# Patient Record
Sex: Female | Born: 1970
Health system: Southern US, Community
[De-identification: ages and names within clinical notes are randomized; demographics above are authoritative.]

## PROBLEM LIST (undated history)

## (undated) DIAGNOSIS — F32A Depression, unspecified: Secondary | ICD-10-CM

## (undated) DIAGNOSIS — R300 Dysuria: Secondary | ICD-10-CM

## (undated) DIAGNOSIS — IMO0002 Reserved for concepts with insufficient information to code with codable children: Secondary | ICD-10-CM

## (undated) DIAGNOSIS — N7093 Salpingitis and oophoritis, unspecified: Secondary | ICD-10-CM

## (undated) DIAGNOSIS — N390 Urinary tract infection, site not specified: Secondary | ICD-10-CM

## (undated) DIAGNOSIS — R030 Elevated blood-pressure reading, without diagnosis of hypertension: Secondary | ICD-10-CM

## (undated) DIAGNOSIS — F329 Major depressive disorder, single episode, unspecified: Secondary | ICD-10-CM

## (undated) DIAGNOSIS — F419 Anxiety disorder, unspecified: Secondary | ICD-10-CM

## (undated) HISTORY — PX: NO PAST SURGERIES: SHX2092

---

## 2014-10-20 ENCOUNTER — Other Ambulatory Visit: Payer: Self-pay

## 2014-10-20 DIAGNOSIS — Z1231 Encounter for screening mammogram for malignant neoplasm of breast: Secondary | ICD-10-CM

## 2014-11-03 ENCOUNTER — Ambulatory Visit
Admission: RE | Admit: 2014-11-03 | Discharge: 2014-11-03 | Disposition: A | Discharge status: Home / Self Care | Payer: 59 | Source: Ambulatory Visit

## 2014-11-03 DIAGNOSIS — Z1231 Encounter for screening mammogram for malignant neoplasm of breast: Secondary | ICD-10-CM

## 2015-04-06 ENCOUNTER — Other Ambulatory Visit (HOSPITAL_COMMUNITY)
Admission: RE | Admit: 2015-04-06 | Discharge: 2015-04-06 | Disposition: A | Discharge status: Home / Self Care | Payer: 59 | Source: Ambulatory Visit | Attending: Obstetrics and Gynecology | Admitting: Obstetrics and Gynecology

## 2015-04-06 DIAGNOSIS — Z01419 Encounter for gynecological examination (general) (routine) without abnormal findings: Secondary | ICD-10-CM | POA: Insufficient documentation

## 2015-04-06 DIAGNOSIS — Z113 Encounter for screening for infections with a predominantly sexual mode of transmission: Secondary | ICD-10-CM | POA: Insufficient documentation

## 2015-04-06 DIAGNOSIS — Z1151 Encounter for screening for human papillomavirus (HPV): Secondary | ICD-10-CM | POA: Diagnosis present

## 2015-05-11 DIAGNOSIS — R55 Syncope and collapse: Secondary | ICD-10-CM | POA: Diagnosis not present

## 2015-05-11 DIAGNOSIS — I10 Essential (primary) hypertension: Secondary | ICD-10-CM | POA: Diagnosis not present

## 2015-05-11 DIAGNOSIS — E559 Vitamin D deficiency, unspecified: Secondary | ICD-10-CM | POA: Diagnosis not present

## 2015-05-11 DIAGNOSIS — F322 Major depressive disorder, single episode, severe without psychotic features: Secondary | ICD-10-CM | POA: Diagnosis not present

## 2015-06-11 MED FILL — NORETHINDRONE 0.35 MG TAB: 0.35 | 84 days supply | Qty: 84 | Fill #0

## 2015-06-17 MED FILL — VIT D2 1.25 MG (50,000 UNIT: 1.25 MG | 84 days supply | Qty: 12 | Fill #0

## 2015-07-26 MED FILL — SERTRALINE HCL 50 MG TABLET: 50 | 90 days supply | Qty: 90 | Fill #0

## 2015-08-26 MED FILL — traZODone HCL 50 MG TABS: 50 | 90 days supply | Qty: 90 | Fill #0

## 2015-09-09 DIAGNOSIS — E559 Vitamin D deficiency, unspecified: Secondary | ICD-10-CM | POA: Diagnosis not present

## 2015-10-11 ENCOUNTER — Encounter (HOSPITAL_BASED_OUTPATIENT_CLINIC_OR_DEPARTMENT_OTHER): Payer: Self-pay | Admitting: Emergency Medicine

## 2015-10-11 ENCOUNTER — Emergency Department (HOSPITAL_BASED_OUTPATIENT_CLINIC_OR_DEPARTMENT_OTHER): Payer: 59

## 2015-10-11 ENCOUNTER — Emergency Department (HOSPITAL_BASED_OUTPATIENT_CLINIC_OR_DEPARTMENT_OTHER)
Admission: EM | Admit: 2015-10-11 | Discharge: 2015-10-12 | Disposition: A | Discharge status: Home / Self Care | Payer: 59 | Attending: Emergency Medicine | Admitting: Emergency Medicine

## 2015-10-11 DIAGNOSIS — E663 Overweight: Secondary | ICD-10-CM | POA: Insufficient documentation

## 2015-10-11 DIAGNOSIS — S43015A Anterior dislocation of left humerus, initial encounter: Secondary | ICD-10-CM | POA: Diagnosis not present

## 2015-10-11 DIAGNOSIS — Z79899 Other long term (current) drug therapy: Secondary | ICD-10-CM | POA: Diagnosis not present

## 2015-10-11 DIAGNOSIS — I10 Essential (primary) hypertension: Secondary | ICD-10-CM | POA: Diagnosis not present

## 2015-10-11 DIAGNOSIS — F17200 Nicotine dependence, unspecified, uncomplicated: Secondary | ICD-10-CM | POA: Diagnosis not present

## 2015-10-11 DIAGNOSIS — M25512 Pain in left shoulder: Secondary | ICD-10-CM | POA: Diagnosis not present

## 2015-10-11 DIAGNOSIS — F329 Major depressive disorder, single episode, unspecified: Secondary | ICD-10-CM | POA: Insufficient documentation

## 2015-10-11 DIAGNOSIS — Y939 Activity, unspecified: Secondary | ICD-10-CM | POA: Insufficient documentation

## 2015-10-11 DIAGNOSIS — Y999 Unspecified external cause status: Secondary | ICD-10-CM | POA: Insufficient documentation

## 2015-10-11 DIAGNOSIS — W010XXA Fall on same level from slipping, tripping and stumbling without subsequent striking against object, initial encounter: Secondary | ICD-10-CM | POA: Insufficient documentation

## 2015-10-11 DIAGNOSIS — S4992XA Unspecified injury of left shoulder and upper arm, initial encounter: Secondary | ICD-10-CM | POA: Diagnosis not present

## 2015-10-11 DIAGNOSIS — Y929 Unspecified place or not applicable: Secondary | ICD-10-CM | POA: Diagnosis not present

## 2015-10-11 HISTORY — DX: Depression, unspecified: F32.A

## 2015-10-11 HISTORY — DX: Anxiety disorder, unspecified: F41.9

## 2015-10-11 HISTORY — DX: Major depressive disorder, single episode, unspecified: F32.9

## 2015-10-11 MED ORDER — OXYCODONE-ACETAMINOPHEN 5-325 MG PO TABS
1.0000 | ORAL_TABLET | Freq: Once | ORAL | Status: AC
  Administered 2015-10-11: 1 via ORAL
  Filled 2015-10-11: qty 1

## 2015-10-11 MED ORDER — LIDOCAINE HCL 2 % IJ SOLN
INTRAMUSCULAR | Status: AC
  Administered 2015-10-12
  Filled 2015-10-11: qty 20

## 2015-10-11 MED ORDER — FENTANYL CITRATE (PF) 100 MCG/2ML IJ SOLN
50.0000 ug | Freq: Once | INTRAMUSCULAR | Status: AC
  Administered 2015-10-12: 50 ug via INTRAVENOUS
  Filled 2015-10-11: qty 2

## 2015-10-11 NOTE — ED Provider Notes (Signed)
CSN: [id number]     Arrival date & time 10/11/15  2215 History  By signing my name below, I, Rowan Blase, attest that this documentation has been prepared under the direction and in the presence of Merryl Hacker, MD . Electronically Signed: Rowan Blase, Scribe. 10/11/2015. 11:15 PM.    Chief Complaint  Patient presents with  . Shoulder Injury    The history is provided by the patient. No language interpreter was used.   HPI Comments:  Sandra Hill is a 45 y.o. female with PMHx of HTN who presents to the Emergency Department s/p fall complaining of sudden onset, severe left shoulder pain. Pt slipped on the floor just PTA and fell with her left arm outstretched. While pt was being taken to x-ray, she states her arm fell off the pillow she was using for support and something in her shoulder shifted; pain has improved since and is currently 4/10. Pt reports associated numbness in arm earlier tonight, currently alleviated. Denies syncope, head trauma, or any major medical problems.   Past Medical History  Diagnosis Date  . Hypertension   . Depression   . Anxiety    History reviewed. No pertinent past surgical history. History reviewed. No pertinent family history. Social History  Substance Use Topics  . Smoking status: Current Some Day Smoker  . Smokeless tobacco: None  . Alcohol Use: Yes   OB History    No data available     Review of Systems  Musculoskeletal: Positive for arthralgias.  Neurological: Negative for syncope and numbness.  All other systems reviewed and are negative.  Allergies  Review of patient's allergies indicates no known allergies.  Home Medications   Prior to Admission medications   Medication Sig Start Date End Date Taking? Authorizing Provider  hydrochlorothiazide (MICROZIDE) 12.5 MG capsule Take 12.5 mg by mouth daily.   Yes Historical Provider, MD  sertraline (ZOLOFT) 50 MG tablet Take 50 mg by mouth daily.   Yes Historical Provider,  MD  traZODone (DESYREL) 100 MG tablet Take 100 mg by mouth at bedtime.   Yes Historical Provider, MD  oxyCODONE-acetaminophen (PERCOCET/ROXICET) 5-325 MG tablet Take 1 tablet by mouth every 6 (six) hours as needed for severe pain. 10/12/15   Merryl Hacker, MD   BP 139/86 mmHg  Pulse 65  Temp(Src) 98.9 F (37.2 C) (Oral)  Resp 18  Ht 5\' 6"  (1.676 m)  Wt 240 lb (108.863 kg)  BMI 38.76 kg/m2  SpO2 97%  LMP 09/27/2015 Physical Exam  Constitutional: She is oriented to person, place, and time. She appears well-developed and well-nourished.  Overweight  HENT:  Head: Normocephalic and atraumatic.  Cardiovascular: Normal rate, regular rhythm and normal heart sounds.   Pulmonary/Chest: Effort normal and breath sounds normal. No respiratory distress. She has no wheezes.  Musculoskeletal:  No tenderness to palpation along the clavicle, no obvious deformity, limited range of motion with abduction and flexion of the shoulder, 2+ radial pulse  Neurological: She is alert and oriented to person, place, and time.  Skin: Skin is warm and dry.  Psychiatric: She has a normal mood and affect.  Nursing note and vitals reviewed.   ED Course  Procedures   Reduction of dislocation Date/Time: 6:55 AM Performed by: Merryl Hacker Authorized by: Merryl Hacker Consent: Verbal consent obtained. Risks and benefits: risks, benefits and alternatives were discussed Consent given by: patient Required items: required blood products, implants, devices, and special equipment available Time out: Immediately prior to procedure a "  time out" was called to verify the correct patient, procedure, equipment, support staff and site/side marked as required.  Patient sedated: no  Vitals: Vital signs were monitored during sedation. Patient tolerance: Patient tolerated the procedure well with no immediate complications. Patient was given 50 g of fentanyl. Joint was injected with 20 mL of lidocaine for  analgesia. Joint: Left shoulder Reduction technique: FARES  DIAGNOSTIC STUDIES:  Oxygen Saturation is 100% on RA, normal by my interpretation.    COORDINATION OF CARE:  11:14 PM Will administer pain medication. Discussed treatment plan with pt at bedside and pt agreed to plan.  Labs Review Labs Reviewed - No data to display  Imaging Review Dg Shoulder Left  10/12/2015  CLINICAL DATA:  Status post reduction of left humeral head dislocation. Initial encounter. EXAM: LEFT SHOULDER - 2+ VIEW COMPARISON:  Left shoulder radiographs performed 10/11/2015 FINDINGS: There has been reduction of the apparent left humeral head dislocation. No definite fracture is seen. There is slight apparent cortical irregularity along the glenoid. The left acromioclavicular joint is unremarkable in appearance. No definite soft tissue abnormalities are characterized on radiograph. IMPRESSION: Successful reduction of apparent left humeral head dislocation. No definite fracture seen. Electronically Signed   By: Garald Balding M.D.   On: 10/12/2015 00:53   Dg Shoulder Left  10/11/2015  CLINICAL DATA:  Slipped and fell on hardwood floor, with injury to left shoulder. Left anterior shoulder pain. Initial encounter. EXAM: LEFT SHOULDER - 2+ VIEW COMPARISON:  None. FINDINGS: There is question of anterior subluxation or mild dislocation of the left humeral head. There is no evidence of fracture. The acromioclavicular joint is unremarkable in appearance. No significant soft tissue abnormalities are seen. The visualized portions of the left lung are clear. IMPRESSION: Question of anterior subluxation or mild dislocation of the left humeral head. No evidence of fracture. Would correlate with the patient's symptoms. Electronically Signed   By: Garald Balding M.D.   On: 10/11/2015 23:26   I have personally reviewed and evaluated these images and lab results as part of my medical decision-making.   EKG Interpretation None       MDM   Final diagnoses:  Anterior shoulder dislocation, left, initial encounter    Patient presents with anterior left shoulder dislocation.Neurovascularly intact. Shoulder was injected with lidocaine and patient was given pain medication. Reduced without palpitation at the bedside. Follow-up with orthopedist.  After history, exam, and medical workup I feel the patient has been appropriately medically screened and is safe for discharge home. Pertinent diagnoses were discussed with the patient. Patient was given return precautions.  I personally performed the services described in this documentation, which was scribed in my presence. The recorded information has been reviewed and is accurate.    Merryl Hacker, MD 10/12/15 [phone]

## 2015-10-11 NOTE — ED Notes (Signed)
Pt states she fell on left shoulder and injured it.

## 2015-10-12 ENCOUNTER — Emergency Department (HOSPITAL_BASED_OUTPATIENT_CLINIC_OR_DEPARTMENT_OTHER): Payer: 59

## 2015-10-12 DIAGNOSIS — Z79899 Other long term (current) drug therapy: Secondary | ICD-10-CM | POA: Diagnosis not present

## 2015-10-12 DIAGNOSIS — F17200 Nicotine dependence, unspecified, uncomplicated: Secondary | ICD-10-CM | POA: Diagnosis not present

## 2015-10-12 DIAGNOSIS — I10 Essential (primary) hypertension: Secondary | ICD-10-CM | POA: Diagnosis not present

## 2015-10-12 DIAGNOSIS — S43005A Unspecified dislocation of left shoulder joint, initial encounter: Secondary | ICD-10-CM | POA: Diagnosis not present

## 2015-10-12 DIAGNOSIS — F329 Major depressive disorder, single episode, unspecified: Secondary | ICD-10-CM | POA: Diagnosis not present

## 2015-10-12 DIAGNOSIS — E663 Overweight: Secondary | ICD-10-CM | POA: Diagnosis not present

## 2015-10-12 DIAGNOSIS — S43015A Anterior dislocation of left humerus, initial encounter: Secondary | ICD-10-CM | POA: Diagnosis not present

## 2015-10-12 MED ORDER — OXYCODONE-ACETAMINOPHEN 5-325 MG PO TABS: 1.0000 | ORAL_TABLET | Freq: Four times a day (QID) | ORAL | Status: DC | PRN

## 2015-10-12 MED FILL — OXYCODONE/APAP 5/325 MG TAB: 5-325 | 2 days supply | Qty: 10 | Fill #0

## 2015-10-12 NOTE — Discharge Instructions (Signed)
Shoulder Dislocation A shoulder dislocation happens when the upper arm bone (humerus) moves out of the shoulder joint. The shoulder joint is the part of the shoulder where the humerus, shoulder blade (scapula), and collarbone (clavicle) meet. CAUSES This condition is often caused by:  A fall.  A hit to the shoulder.  A forceful movement of the shoulder. RISK FACTORS This condition is more likely to develop in people who play sports. SYMPTOMS Symptoms of this condition include:  Deformity of the shoulder.  Intense pain.  Inability to move the shoulder.  Numbness, weakness, or tingling in your neck or down your arm.  Bruising or swelling around your shoulder. DIAGNOSIS This condition is diagnosed with a physical exam. After the exam, tests may be done to check for related problems. Tests that may be done include:  X-ray. This may be done to check for broken bones.  MRI. This may be done to check for damage to the tissues around the shoulder.  Electromyogram. This may be done to check for nerve damage. TREATMENT This condition is treated with a procedure to place the humerus back in the joint. This procedure is called a reduction. There are two types of reduction:  Closed reduction. In this procedure, the humerus is placed back in the joint without surgery. The health care provider uses his or her hands to guide the bone back into place.  Open reduction. In this procedure, the humerus is placed back in the joint with surgery. An open reduction may be recommended if:  You have a weak shoulder joint or weak ligaments.  You have had more than one shoulder dislocation.  The nerves or blood vessels around your shoulder have been damaged. After the humerus is placed back into the joint, your arm will be placed in a splint or sling to prevent it from moving. You will need to wear the splint or sling until your shoulder heals. When the splint or sling is removed, you may have  physical therapy to help improve the range of motion in your shoulder joint. HOME CARE INSTRUCTIONS If You Have a Splint or Sling:  Wear it as told by your health care provider. Remove it only as told by your health care provider.  Loosen it if your fingers become numb and tingle, or if they turn cold and blue.  Keep it clean and dry. Bathing  Do not take baths, swim, or use a hot tub until your health care provider approves. Ask your health care provider if you can take showers. You may only be allowed to take sponge baths for bathing.  If your health care provider approves bathing and showering, cover your splint or sling with a watertight plastic bag to protect it from water. Do not let the splint or sling get wet. Managing Pain, Stiffness, and Swelling  If directed, apply ice to the injured area.  Put ice in a plastic bag.  Place a towel between your skin and the bag.  Leave the ice on for 20 minutes, 2-3 times per day.  Move your fingers often to avoid stiffness and to decrease swelling.  Raise (elevate) the injured area above the level of your heart while you are sitting or lying down. Driving  Do not drive while wearing a splint or sling on a hand that you use for driving.  Do not drive or operate heavy machinery while taking pain medicine. Activity  Return to your normal activities as told by your health care provider. Ask your  health care provider what activities are safe for you.  Perform range-of-motion exercises only as told by your health care provider.  Exercise your hand by squeezing a soft ball. This helps to decrease stiffness and swelling in your hand and wrist. General Instructions  Take over-the-counter and prescription medicines only as told by your health care provider.  Do not use any tobacco products, including cigarettes, chewing tobacco, or e-cigarettes. Tobacco can delay bone and tissue healing. If you need help quitting, ask your health care  provider.  Keep all follow-up visits as told by your health care provider. This is important. SEEK MEDICAL CARE IF:  Your splint or sling gets damaged. SEEK IMMEDIATE MEDICAL CARE IF:  Your pain gets worse rather than better.  You lose feeling in your arm or hand.  Your arm or hand becomes white and cold.   This information is not intended to replace advice given to you by your health care provider. Make sure you discuss any questions you have with your health care provider.   Document Released: 01/03/2001 Document Revised: 12/30/2014 Document Reviewed: 08/03/2014 Elsevier Interactive Patient Education [employer].

## 2015-10-18 DIAGNOSIS — F322 Major depressive disorder, single episode, severe without psychotic features: Secondary | ICD-10-CM | POA: Diagnosis not present

## 2015-10-18 DIAGNOSIS — I10 Essential (primary) hypertension: Secondary | ICD-10-CM | POA: Diagnosis not present

## 2015-10-18 MED FILL — SERTRALINE HCL 100 MG TAB: 100 | 30 days supply | Qty: 30 | Fill #0

## 2015-10-19 ENCOUNTER — Encounter: Payer: Self-pay | Admitting: Family Medicine

## 2015-10-19 ENCOUNTER — Ambulatory Visit (INDEPENDENT_AMBULATORY_CARE_PROVIDER_SITE_OTHER): Payer: 59 | Admitting: Family Medicine

## 2015-10-19 VITALS — BP 145/96 | HR 73 | Ht 67.0 in | Wt 245.0 lb

## 2015-10-19 DIAGNOSIS — S4992XA Unspecified injury of left shoulder and upper arm, initial encounter: Secondary | ICD-10-CM | POA: Diagnosis not present

## 2015-10-19 DIAGNOSIS — S43005A Unspecified dislocation of left shoulder joint, initial encounter: Secondary | ICD-10-CM

## 2015-10-19 NOTE — Patient Instructions (Signed)
You had a shoulder dislocation. Use sling for next 2 weeks unless physical therapy recommends discontinuing this before I see you. Ibuprofen or aleve only if needed. Percocet if needed for severe pain. Icing 15 minutes at a time as needed (use after therapy) Do home exercises on days you don't go to therapy. Come out of sling at least twice a day to do easy elbow motion exercises (straighten, flex 10 times). Follow up with me in 2 weeks. See work note for restrictions.

## 2015-10-21 DIAGNOSIS — S43005A Unspecified dislocation of left shoulder joint, initial encounter: Secondary | ICD-10-CM | POA: Insufficient documentation

## 2015-10-21 NOTE — Assessment & Plan Note (Signed)
first dislocation.  Clinically much improved.  Radiographs did not show associated fracture.  Will continue with sling for 1-2 more weeks, start physical therapy now.  Ibuprofen or aleve if needed, percocet as needed for severe pain.  Icing for pain.  F/u in 2 weeks.  Work note provided.

## 2015-10-21 NOTE — Progress Notes (Signed)
PCP: No primary care provider on file.  Subjective:   HPI: Patient is a 45 y.o. female here for left shoulder injury.  Patient reports on 6/20 she was at home helping put drops in a dog's ears. Her left hand was on the ground, slid laterally and caused her left shoulder to pop out of place. Immediate very severe pain. Went to ED - had this relocated. Now pain is down to 2/10, more dull. Has been resting in sling. This is first dislocation. Feels achy in morning. Hard to dress. Not taking any medication for this now. No skin changes, numbness.  Past Medical History  Diagnosis Date  . Hypertension   . Depression   . Anxiety     Current Outpatient Prescriptions on File Prior to Visit  Medication Sig Dispense Refill  . hydrochlorothiazide (MICROZIDE) 12.5 MG capsule Take 12.5 mg by mouth daily.    Marland Kitchen oxyCODONE-acetaminophen (PERCOCET/ROXICET) 5-325 MG tablet Take 1 tablet by mouth every 6 (six) hours as needed for severe pain. 10 tablet 0  . sertraline (ZOLOFT) 50 MG tablet Take 50 mg by mouth daily.    . traZODone (DESYREL) 100 MG tablet Take 100 mg by mouth at bedtime.     No current facility-administered medications on file prior to visit.    No past surgical history on file.  No Known Allergies  Social History   Social History  . Marital Status: Married    Spouse Name: N/A  . Number of Children: N/A  . Years of Education: N/A   Occupational History  . Not on file.   Social History Main Topics  . Smoking status: Current Some Day Smoker  . Smokeless tobacco: Not on file  . Alcohol Use: 0.0 oz/week    0 Standard drinks or equivalent per week  . Drug Use: No  . Sexual Activity: Not on file   Other Topics Concern  . Not on file   Social History Narrative    No family history on file.  BP 145/96 mmHg  Pulse 73  Ht 5\' 7"  (1.702 m)  Wt 245 lb (111.131 kg)  BMI 38.36 kg/m2  LMP 09/27/2015  Review of Systems: See HPI above.    Objective:  Physical  Exam:  Gen: NAD, comfortable in exam room  Left shoulder: No swelling, ecchymoses.  No gross deformity. No TTP. Full IR and ER.  Did not test extent of flexion and abduction today. FROM elbow, wrist, digits with 5/5 strength. Strength 5/5 with resisted internal/external rotation. NV intact distally. Sensation intact to light touch including axillary distribution.    Assessment & Plan:  1. Left shoulder dislocation - first dislocation.  Clinically much improved.  Radiographs did not show associated fracture.  Will continue with sling for 1-2 more weeks, start physical therapy now.  Ibuprofen or aleve if needed, percocet as needed for severe pain.  Icing for pain.  F/u in 2 weeks.  Work note provided.

## 2015-10-28 DIAGNOSIS — N7011 Chronic salpingitis: Secondary | ICD-10-CM | POA: Diagnosis not present

## 2015-10-28 DIAGNOSIS — N83209 Unspecified ovarian cyst, unspecified side: Secondary | ICD-10-CM | POA: Diagnosis not present

## 2015-11-02 ENCOUNTER — Ambulatory Visit: Payer: 59 | Attending: Family Medicine | Admitting: Rehabilitative and Restorative Service Providers"

## 2015-11-02 ENCOUNTER — Ambulatory Visit: Payer: Self-pay | Admitting: Family Medicine

## 2015-11-02 DIAGNOSIS — M6281 Muscle weakness (generalized): Secondary | ICD-10-CM | POA: Diagnosis not present

## 2015-11-02 DIAGNOSIS — R293 Abnormal posture: Secondary | ICD-10-CM | POA: Insufficient documentation

## 2015-11-02 DIAGNOSIS — M25512 Pain in left shoulder: Secondary | ICD-10-CM | POA: Diagnosis not present

## 2015-11-02 NOTE — Patient Instructions (Signed)
Advised pt to sleep in sling at night to prevent her from rolling onto L side; HEP issued consisting of shoulder ext isometric with elbow extended and flexed with 3 sec holds each supine; standing shoulder ER/shoulder flex/adduction isometric with towel roll x 3 sec each. All x 15 reps 1x/day. Advised pt to continue to ice due to swelling around scapula and clavicle L. Pt demonstrated understanding

## 2015-11-02 NOTE — Therapy (Signed)
[facility] [address], [place], [place], [address] Phone: [phone]   Fax:  [phone]  Physical Therapy Evaluation  Patient Details  Name: Sandra Hill MRN: [id number] Date of Birth: 09-05-1970 Referring Provider: Barbaraann Barthel  Encounter Date: 11/02/2015      PT End of Session - 11/02/15 0929    Visit Number 1   Number of Visits 16   Date for PT Re-Evaluation 12/28/15   PT Start Time 0804   PT Stop Time 0900   PT Time Calculation (min) 56 min   Activity Tolerance Patient tolerated treatment well;No increased pain   Behavior During Therapy [facility] for tasks assessed/performed      Past Medical History  Diagnosis Date  . Hypertension   . Depression   . Anxiety     History reviewed. No pertinent past surgical history.  There were no vitals filed for this visit.       Subjective Assessment - 11/02/15 0915    Subjective "It only hurts with certain motions"   Pertinent History Pt is a 45 YO L shoulder dislocation when putting drops in dogs outs with L hand anchored on ground when it slid laterally causing shoulder dislocation. XRays only performed at hospital. MD orders with sling 1-2 weeks as of 10/19/15. Relocation at ER. Pt reports "pins and needles" in AM only. Pt now with decreased shoulder ROM, strength L and with overall decreased mobility.   Limitations Lifting;Writing;House hold activities   How long can you sit comfortably? 30 + minutes   How long can you stand comfortably? 30 + minutes   How long can you walk comfortably? 30 + minutes   Diagnostic tests Xrays only   Patient Stated Goals to return to my daily routine of household chores and putting on my bra normally   Currently in Pain? Yes   Pain Score 4    Pain Location Shoulder   Pain Orientation Left   Pain Descriptors / Indicators Pins and needles;Nagging;Aching   Pain Type Neuropathic pain   Pain Radiating Towards biceps L   Pain Onset 1  to 4 weeks ago   Pain Frequency Intermittent   Aggravating Factors  putting on bra, closing car door   Pain Relieving Factors rest, ice   Effect of Pain on Daily Activities moderate; has to find compensatory strategies   Multiple Pain Sites No            [facility] PT Assessment - 11/02/15 0001    Assessment   Medical Diagnosis L shoulder pain s/p disclocation   Referring Provider [place]   Onset Date/Surgical Date 10/11/15   Hand Dominance --  right   Next MD Visit --  not scheduled   Prior Therapy none   Precautions   Precautions --  sling off November 16 2015   Precaution Comments not overhead flexion   Required Braces or Orthoses Sling  2 weeks   Restrictions   Weight Bearing Restrictions --  on light duty at work, no ladder usage until MD followup   Balance Screen   Has the patient fallen in the past 6 months --  no   [place] --  lives with spouse   Prior Function   Level of Independence --  MI   Vocation --  works for [employer] in [employer]; has to type   Cognition   Overall Cognitive Status --  alert and oriented x 4   Observation/Other Assessments  Skin Integrity good   Observation/Other Assessments-Edema    Edema --  mild edema along L scapula and clavicle   Sensation   Light Touch --  decreased sensation along L scapula and clavicle   Coordination   Gross Motor Movements are Fluid and Coordinated --  pt hesitant to perform all AROM with hitch in motion    Posture/Postural Control   Posture/Postural Control --  L shoulder lower/slightly rounded, L scapula higher   ROM / Strength   AROM / PROM / Strength AROM;PROM;Strength   AROM   Overall AROM  Due to pain   Overall AROM Comments R shoulder AROM WFL with ER T3 and IR T6 measured in sitting; L shoulder flex to pain at 82 and thru pain to 91, abdct 84, ER T2 and IR T10  Biceps/Triceps AROM WNL   AROM Assessment Site --  Biceps   PROM   Overall PROM  --  PROM flex to  110, abdct to 121, ER to 84, IR WNL all w/pain    Overall PROM Comments pain at end ROM at Biceps;    Strength   Overall Strength --  L shoulder flex 3, abdct 3+, IR/ER 3-, elbow flex 4-/ext 3+   Special Tests    Special Tests --  Relocation test - but + for pain                           PT Education - 11/02/15 0921    Education provided Yes   Education Details see pt instructions; advised pt to discontinue if begins experiencing pain   Person(s) Educated Patient   Methods Explanation;Demonstration;Tactile cues;Verbal cues;Handout   Comprehension Verbalized understanding;Returned demonstration          PT Short Term Goals - 11/02/15 0925    PT SHORT TERM GOAL #1   Title Pt will be I with initial HEP   Time 3   Period Weeks   Status New   PT SHORT TERM GOAL #2   Title Pt will demo improved shoulder abdct to 102 degrees to assist with dressing   Time 3   Period Weeks   Status New   PT SHORT TERM GOAL #3   Title Pt will demo improved L shoulder IR to T7 to assist with putting on bra   Time 3   Period Weeks   Status New   PT SHORT TERM GOAL #4   Title Pt will report improved L shoulder pain to 4/10 consistently to assist with work duties   Time 3   Period Weeks   Status New           PT Long Term Goals - 11/02/15 0926    PT LONG TERM GOAL #1   Title Pt will demonstrate improved L shoulder strength >/= 4+/5 all major muscle groups to assist with household chores   Time 8   Period Weeks   Status New   PT LONG TERM GOAL #2   Title Pt will be able to hold laundry basket with 75% less difficulty   Time 8   Period Weeks   Status New   PT LONG TERM GOAL #3   Title pt will be able to dress/bathe with no difficulty or compensations 100% of the time   Time 8   Period Weeks   Status New   PT LONG TERM GOAL #4   Title Pt will demo improved L shoulder AROM all planes  to assist with reaching for cup   Time 8   Period Weeks   Status New                Plan - 11/02/15 [id number]    Clinical Impression Statement Pt demonstrates decreased L shoulder AROM and strength s/p shoulder dislocation 10/11/15. Pt with moderate swelling due to above and would benefit from therapy from taping as appropriate for edema, strengthening and ROM to improve functional mobility.   Rehab Potential Good   Clinical Impairments Affecting Rehab Potential pain   PT Frequency 2x / week   PT Duration 8 weeks   PT Treatment/Interventions ADLs/Self Care Home Management;Cryotherapy;Electrical Stimulation;Iontophoresis 4mg /ml Dexamethasone;Moist Heat;Ultrasound;Therapeutic exercise;Therapeutic activities;Functional mobility training;Neuromuscular re-education;Patient/family education;Manual techniques;Passive range of motion;Taping;Vasopneumatic Device   PT Next Visit Plan review HEP, reassess motion, taping (?)   PT Home Exercise Plan ice, use sling at night; see pt instructions for HEP info   Recommended Other Services none   Consulted and Agree with Plan of Care Patient      Patient will benefit from skilled therapeutic intervention in order to improve the following deficits and impairments:  Decreased activity tolerance, Decreased mobility, Decreased range of motion, Decreased strength, Increased edema  Visit Diagnosis: Muscle weakness (generalized)  Pain in left shoulder  Abnormal posture     Problem List Patient Active Problem List   Diagnosis Date Noted  . Dislocation of left shoulder joint 10/21/2015    ARTIS,Tatiyana Foucher, PT 11/02/2015, 9:32 AM  [facility] [address] [place], [place], [address] Phone: [phone]   Fax:  [phone]  Name: Sandra Hill MRN: [id number] Date of Birth: 29-Nov-1970

## 2015-11-15 DIAGNOSIS — F322 Major depressive disorder, single episode, severe without psychotic features: Secondary | ICD-10-CM | POA: Diagnosis not present

## 2015-11-15 MED FILL — traZODone HCL 50 MG TABS: 50 | 30 days supply | Qty: 30 | Fill #0

## 2015-11-15 MED FILL — SERTRALINE HCL 100 MG TAB: 100 | 30 days supply | Qty: 45 | Fill #0

## 2015-11-16 ENCOUNTER — Encounter: Payer: Self-pay | Admitting: Physical Therapy

## 2015-11-16 ENCOUNTER — Ambulatory Visit: Payer: 59 | Admitting: Physical Therapy

## 2015-11-16 DIAGNOSIS — R293 Abnormal posture: Secondary | ICD-10-CM

## 2015-11-16 DIAGNOSIS — M6281 Muscle weakness (generalized): Secondary | ICD-10-CM

## 2015-11-16 DIAGNOSIS — M25512 Pain in left shoulder: Secondary | ICD-10-CM | POA: Diagnosis not present

## 2015-11-16 NOTE — Therapy (Addendum)
[facility] [address], [place], [place], [address] Phone: [phone]   Fax:  [phone]  Physical Therapy Treatment  Patient Details  Name: Sandra Hill MRN: [id number] Date of Birth: Jan 08, 1971 Referring Provider: Barbaraann Barthel  Encounter Date: 11/16/2015      PT End of Session - 11/18/15 1654    Visit Number 3   Date for PT Re-Evaluation 12/28/15   PT Start Time 0240   PT Stop Time 9735   PT Time Calculation (min) 38 min   Activity Tolerance Patient tolerated treatment well;No increased pain   Behavior During Therapy WFL for tasks assessed/performed      Past Medical History:  Diagnosis Date  . Anxiety   . Depression   . Hypertension     History reviewed. No pertinent surgical history.  There were no vitals filed for this visit.      Subjective Assessment - 11/18/15 1619    Subjective I lost the motion I had.  I felt good for the day and now it is sore.    Pertinent History Pt is a 45 YO L shoulder dislocation when putting drops in dogs outs with L hand anchored on ground when it slid laterally causing shoulder dislocation. XRays only performed at hospital. MD orders with sling 1-2 weeks as of 10/19/15. Relocation at ER. Pt reports "pins and needles" in AM only. Pt now with decreased shoulder ROM, strength L and with overall decreased mobility.   Limitations Lifting;Writing;House hold activities   How long can you sit comfortably? 30 + minutes   How long can you stand comfortably? 30 + minutes   How long can you walk comfortably? 30 + minutes   Diagnostic tests Xrays only   Patient Stated Goals to return to my daily routine of household chores and putting on my bra normally   Currently in Pain? Yes   Pain Score 2    Pain Location Shoulder   Pain Orientation Left   Pain Descriptors / Indicators Aching   Pain Onset 1 to 4 weeks ago   Pain Frequency Constant   Aggravating Factors  putting on bra,  closing car door   Pain Relieving Factors rest,ice   Effect of Pain on Daily Activities moderate, has to find compesatory stategies   Multiple Pain Sites No                         [facility] Adult PT Treatment/Exercise - 11/18/15 0001      Shoulder Exercises: Supine   Other Supine Exercises supine left shoulder at 90 degrees flexion oscilating arm in midrange side to side, up /down, Cw/CC 2 sets of 10 and ER/IR with arm at side   Other Supine Exercises left shoulder punches with assistance 10x2     Shoulder Exercises: Sidelying   Other Sidelying Exercises scapula diagonals in right sidely and upward/downward     Shoulder Exercises: Pulleys   Flexion 2 minutes   ABduction 2 minutes   ABduction Limitations painfree range for scaption     Shoulder Exercises: Isometric Strengthening   Flexion Other (comment)  hold 5 sec 5 times standing; left   Extension Other (comment)  hold 5 sec 5 times standing, left   External Rotation Other (comment)  5 sec, 5 times standing; left   Internal Rotation Other (comment)  5 sec hold, 5 times left, standing   ABduction Other (comment)  5 sec hold, 5 times, left stand  Manual Therapy   Manual Therapy Soft tissue mobilization;Passive ROM   Soft tissue mobilization left biceps, left shoulder girdlie, left triceps,   Passive ROM left shoulder for flexion and abduction to 90 degrees, ER at patients side                PT Education - 11/18/15 1630    Education provided Yes   Education Details shoulder isometrics   Person(s) Educated Patient   Methods Explanation;Demonstration;Verbal cues;Handout   Comprehension Returned demonstration;Verbalized understanding          PT Short Term Goals - 11/16/15 1700      PT SHORT TERM GOAL #1   Title Pt will be I with initial HEP   Time 3   Period Weeks   Status Achieved     PT SHORT TERM GOAL #2   Title Pt will demo improved shoulder abdct to 102 degrees to assist with  dressing   Time 3   Period Weeks   Status New     PT SHORT TERM GOAL #3   Title Pt will demo improved L shoulder IR to T7 to assist with putting on bra   Time 3   Period Weeks   Status New     PT SHORT TERM GOAL #4   Title Pt will report improved L shoulder pain to 4/10 consistently to assist with work duties   Time 3   Period Weeks   Status New           PT Long Term Goals - 11/02/15 0926      PT LONG TERM GOAL #1   Title Pt will demonstrate improved L shoulder strength >/= 4+/5 all major muscle groups to assist with household chores   Time 8   Period Weeks   Status New     PT LONG TERM GOAL #2   Title Pt will be able to hold laundry basket with 75% less difficulty   Time 8   Period Weeks   Status New     PT LONG TERM GOAL #3   Title pt will be able to dress/bathe with no difficulty or compensations 100% of the time   Time 8   Period Weeks   Status New     PT LONG TERM GOAL #4   Title Pt will demo improved L shoulder AROM all planes to assist with reaching for cup   Time 8   Period Weeks   Status New               Plan - 11/18/15 1654    Clinical Impression Statement Patient has not met goals due to just starting.  Patient has tightness in left upper arm and shoulder girdle.  Patient arm will fatique after doing the isometrics. Patient has reduction of pain in left shoulder after therapy.    Rehab Potential Good   Clinical Impairments Affecting Rehab Potential pain   PT Frequency 2x / week   PT Duration 8 weeks   PT Treatment/Interventions ADLs/Self Care Home Management;Cryotherapy;Electrical Stimulation;Iontophoresis 66m/ml Dexamethasone;Moist Heat;Ultrasound;Therapeutic exercise;Therapeutic activities;Functional mobility training;Neuromuscular re-education;Patient/family education;Manual techniques;Passive range of motion;Taping;Vasopneumatic Device   PT Next Visit Plan measure left shoulder AROM, wall walking, bicep strengthening   PT Home Exercise  Plan progress as needed   Consulted and Agree with Plan of Care Patient      Patient will benefit from skilled therapeutic intervention in order to improve the following deficits and impairments:  Decreased activity tolerance, Decreased mobility, Decreased  range of motion, Decreased strength, Increased edema  Visit Diagnosis: Muscle weakness (generalized)  Pain in left shoulder  Abnormal posture     Problem List Patient Active Problem List   Diagnosis Date Noted  . Dislocation of left shoulder joint 10/21/2015    Earlie Counts, PT 11/18/15 5:00 PM   [facility] [address] [place], [place], [address] Phone: [phone]   Fax:  [phone]  Name: Sandra Hill MRN: [id number] Date of Birth: 1971/02/02

## 2015-11-18 ENCOUNTER — Ambulatory Visit: Payer: 59 | Admitting: Physical Therapy

## 2015-11-18 ENCOUNTER — Encounter: Payer: Self-pay | Admitting: Physical Therapy

## 2015-11-18 DIAGNOSIS — R293 Abnormal posture: Secondary | ICD-10-CM | POA: Diagnosis not present

## 2015-11-18 DIAGNOSIS — M6281 Muscle weakness (generalized): Secondary | ICD-10-CM

## 2015-11-18 DIAGNOSIS — M25512 Pain in left shoulder: Secondary | ICD-10-CM | POA: Diagnosis not present

## 2015-11-18 NOTE — Therapy (Signed)
[facility] [address] [place], [place], [address] Phone: [phone]   Fax:  [phone]  Physical Therapy Treatment  Patient Details  Name: Sandra Hill MRN: [id number] Date of Birth: [birth year] Referring Provider: Barbaraann Barthel  Encounter Date: 11/18/2015      PT End of Session - 11/18/15 1654    Visit Number 3   Date for PT Re-Evaluation 12/28/15   PT Start Time 2355   PT Stop Time 7322   PT Time Calculation (min) 38 min   Activity Tolerance Patient tolerated treatment well;No increased pain   Behavior During Therapy WFL for tasks assessed/performed      Past Medical History:  Diagnosis Date  . Anxiety   . Depression   . Hypertension     History reviewed. No pertinent surgical history.  There were no vitals filed for this visit.      Subjective Assessment - 11/18/15 1619    Subjective I lost the motion I had.  I felt good for the day and now it is sore.    Pertinent History Pt is a 45 YO L shoulder dislocation when putting drops in dogs outs with L hand anchored on ground when it slid laterally causing shoulder dislocation. XRays only performed at hospital. MD orders with sling 1-2 weeks as of 10/19/15. Relocation at ER. Pt reports "pins and needles" in AM only. Pt now with decreased shoulder ROM, strength L and with overall decreased mobility.   Limitations Lifting;Writing;House hold activities   How long can you sit comfortably? 30 + minutes   How long can you stand comfortably? 30 + minutes   How long can you walk comfortably? 30 + minutes   Diagnostic tests Xrays only   Patient Stated Goals to return to my daily routine of household chores and putting on my bra normally   Currently in Pain? Yes   Pain Score 2    Pain Location Shoulder   Pain Orientation Left   Pain Descriptors / Indicators Aching   Pain Onset 1 to 4 weeks ago   Pain Frequency Constant   Aggravating Factors  putting on bra,  closing car door   Pain Relieving Factors rest,ice   Effect of Pain on Daily Activities moderate, has to find compesatory stategies   Multiple Pain Sites No                         [facility] Adult PT Treatment/Exercise - 11/18/15 0001      Shoulder Exercises: Supine   Other Supine Exercises supine left shoulder at 90 degrees flexion oscilating arm in midrange side to side, up /down, Cw/CC 2 sets of 10 and ER/IR with arm at side   Other Supine Exercises left shoulder punches with assistance 10x2     Shoulder Exercises: Sidelying   Other Sidelying Exercises scapula diagonals in right sidely and upward/downward     Shoulder Exercises: Pulleys   Flexion 2 minutes   ABduction 2 minutes   ABduction Limitations painfree range for scaption     Shoulder Exercises: Isometric Strengthening   Flexion Other (comment)  hold 5 sec 5 times standing; left   Extension Other (comment)  hold 5 sec 5 times standing, left   External Rotation Other (comment)  5 sec, 5 times standing; left   Internal Rotation Other (comment)  5 sec hold, 5 times left, standing   ABduction Other (comment)  5 sec hold, 5 times, left stand  Manual Therapy   Manual Therapy Soft tissue mobilization;Passive ROM   Soft tissue mobilization left biceps, left shoulder girdlie, left triceps,   Passive ROM left shoulder for flexion and abduction to 90 degrees, ER at patients side                PT Education - 11/18/15 1630    Education provided Yes   Education Details shoulder isometrics   Person(s) Educated Patient   Methods Explanation;Demonstration;Verbal cues;Handout   Comprehension Returned demonstration;Verbalized understanding          PT Short Term Goals - 11/16/15 1700      PT SHORT TERM GOAL #1   Title Pt will be I with initial HEP   Time 3   Period Weeks   Status Achieved     PT SHORT TERM GOAL #2   Title Pt will demo improved shoulder abdct to 102 degrees to assist with  dressing   Time 3   Period Weeks   Status New     PT SHORT TERM GOAL #3   Title Pt will demo improved L shoulder IR to T7 to assist with putting on bra   Time 3   Period Weeks   Status New     PT SHORT TERM GOAL #4   Title Pt will report improved L shoulder pain to 4/10 consistently to assist with work duties   Time 3   Period Weeks   Status New           PT Long Term Goals - 11/02/15 0926      PT LONG TERM GOAL #1   Title Pt will demonstrate improved L shoulder strength >/= 4+/5 all major muscle groups to assist with household chores   Time 8   Period Weeks   Status New     PT LONG TERM GOAL #2   Title Pt will be able to hold laundry basket with 75% less difficulty   Time 8   Period Weeks   Status New     PT LONG TERM GOAL #3   Title pt will be able to dress/bathe with no difficulty or compensations 100% of the time   Time 8   Period Weeks   Status New     PT LONG TERM GOAL #4   Title Pt will demo improved L shoulder AROM all planes to assist with reaching for cup   Time 8   Period Weeks   Status New               Plan - 11/18/15 1654    Clinical Impression Statement Patient has not met goals due to just starting.  Patient has tightness in left upper arm and shoulder girdle.  Patient arm will fatique after doing the isometrics. Patient has reduction of pain in left shoulder after therapy.    Rehab Potential Good   Clinical Impairments Affecting Rehab Potential pain   PT Frequency 2x / week   PT Duration 8 weeks   PT Treatment/Interventions ADLs/Self Care Home Management;Cryotherapy;Electrical Stimulation;Iontophoresis 66m/ml Dexamethasone;Moist Heat;Ultrasound;Therapeutic exercise;Therapeutic activities;Functional mobility training;Neuromuscular re-education;Patient/family education;Manual techniques;Passive range of motion;Taping;Vasopneumatic Device   PT Next Visit Plan measure left shoulder AROM, wall walking, bicep strengthening   PT Home Exercise  Plan progress as needed   Consulted and Agree with Plan of Care Patient      Patient will benefit from skilled therapeutic intervention in order to improve the following deficits and impairments:  Decreased activity tolerance, Decreased mobility, Decreased  range of motion, Decreased strength, Increased edema  Visit Diagnosis: Muscle weakness (generalized)  Pain in left shoulder     Problem List Patient Active Problem List   Diagnosis Date Noted  . Dislocation of left shoulder joint 10/21/2015    Earlie Counts, PT 11/18/15 4:57 PM   [facility] [address] [place], [place], [address] Phone: [phone]   Fax:  [phone]  Name: Sandra Hill MRN: [id number] Date of Birth: 26-Aug-1970

## 2015-11-18 NOTE — Patient Instructions (Addendum)
Strengthening: Isometric Abduction   5 Using wall for resistance, press left arm into ball using light pressure. Hold ____ seconds. Repeat _5___ times per set. Do _1___ sets per session. Do ___1-2_ sessions per day.  http://orth.exer.us/806   Copyright  VHI. All rights reserved.  Strengthening: Isometric Flexion    Using wall for resistance, press right fist into ball using light pressure. Hold _5___ seconds. Repeat ___5_ times per set. Do __1__ sets per session. Do _1-2___ sessions per day.  http://orth.exer.us/800   Copyright  VHI. All rights reserved.  Strengthening: Isometric External Rotation    Using wall to provide resistance, and keeping right arm at side, press back of hand into ball using light pressure. Hold _5___ seconds. Repeat __5__ times per set. Do ____ sets per session. Do __1-2__ sessions per day.  http://orth.exer.us/814   Copyright  VHI. All rights reserved.  Strengthening: Isometric Extension    Using wall for resistance, press back of left arm into ball using light pressure. Hold __5__ seconds. Repeat _5___ times per set. Do __1__ sets per session. Do _1-2___ sessions per day.  http://orth.exer.us/804   Copyright  VHI. All rights reserved.  Strengthening: Isometric Internal Rotation    Using door frame for resistance, press palm of right hand into ball using light pressure. Keep elbow in at side. Hold _5___ seconds. Repeat __5__ times per set. Do _1___ sets per session. Do _1-2___ sessions per day.  http://orth.exer.us/816   Copyright  VHI. All rights reserved.   [facility] [address], [place], [place] [address] Phone # [phone] Fax [phone]

## 2015-11-23 ENCOUNTER — Encounter: Payer: Self-pay | Admitting: Physical Therapy

## 2015-11-23 ENCOUNTER — Ambulatory Visit: Payer: 59 | Attending: Family Medicine | Admitting: Physical Therapy

## 2015-11-23 DIAGNOSIS — R293 Abnormal posture: Secondary | ICD-10-CM | POA: Diagnosis not present

## 2015-11-23 DIAGNOSIS — M6281 Muscle weakness (generalized): Secondary | ICD-10-CM | POA: Diagnosis not present

## 2015-11-23 DIAGNOSIS — M25512 Pain in left shoulder: Secondary | ICD-10-CM | POA: Diagnosis not present

## 2015-11-23 NOTE — Therapy (Signed)
[facility] [address] [place], [place], [address] Phone: [phone]   Fax:  [phone]  Physical Therapy Treatment  Patient Details  Name: Sandra Hill MRN: [id number] Date of Birth: Oct 09, 1970 Referring Provider: Barbaraann Barthel  Encounter Date: 11/23/2015      PT End of Session - 11/23/15 1610    Visit Number 4   Number of Visits 16   Date for PT Re-Evaluation 12/28/15   PT Start Time [id number]   PT Stop Time 1610   PT Time Calculation (min) 40 min   Activity Tolerance Patient tolerated treatment well;No increased pain   Behavior During Therapy WFL for tasks assessed/performed      Past Medical History:  Diagnosis Date  . Anxiety   . Depression   . Hypertension     History reviewed. No pertinent surgical history.  There were no vitals filed for this visit.      Subjective Assessment - 11/23/15 1538    Subjective I went to get my beagle and stumbled in hole and lifted arms for balance.    Pertinent History Pt is a 45 YO L shoulder dislocation when putting drops in dogs outs with L hand anchored on ground when it slid laterally causing shoulder dislocation. XRays only performed at hospital. MD orders with sling 1-2 weeks as of 10/19/15. Relocation at ER. Pt reports "pins and needles" in AM only. Pt now with decreased shoulder ROM, strength L and with overall decreased mobility.   Limitations Lifting;Writing;House hold activities   How long can you sit comfortably? 30 + minutes   How long can you stand comfortably? 30 + minutes   How long can you walk comfortably? 30 + minutes   Diagnostic tests Xrays only   Patient Stated Goals to return to my daily routine of household chores and putting on my bra normally   Currently in Pain? Yes   Pain Score 3    Pain Location Shoulder   Pain Orientation Left   Pain Descriptors / Indicators Aching   Pain Type Acute pain   Pain Onset 1 to 4 weeks ago   Pain Frequency  Constant   Aggravating Factors  putting on bra, closing car door   Pain Relieving Factors rest, ice   Effect of Pain on Daily Activities moderate, has to find compensatory strategies   Multiple Pain Sites No            [facility] PT Assessment - 11/23/15 0001      AROM   Left Shoulder Flexion 115 Degrees   Left Shoulder ABduction 100 Degrees   Left Shoulder Internal Rotation --  reach to T10                     [facility] Adult PT Treatment/Exercise - 11/23/15 0001      Elbow Exercises   Elbow Flexion Strengthening;Both;20 reps  2#   Elbow Extension Power Tower;Strengthening;Both;20 reps  20#     Shoulder Exercises: Supine   External Rotation AAROM;Strengthening;Left;20 reps  ata 40 degrees abduction   Flexion AAROM;Strengthening;Left;20 reps   ABduction 20 reps;AAROM;Strengthening;Left  scaption   Other Supine Exercises supine left shoulder at 90 degrees flexion oscilating arm in midrange side to side, up /down, Cw/CC 2 sets of 10 and ER/IR with arm at side     Shoulder Exercises: Standing   Flexion AAROM;Strengthening;Left;15 reps  rolling green physioball   ABduction AAROM;Left;15 reps  rolling green physioball on mat   Row  Left;Strengthening;10 reps;Weights  3 reps leaning over counter   Row Weight (lbs) 2   Other Standing Exercises stand on stool while moving red physioball side to side 30x, forward/back 30x, press into ball hold 5 sec 10times;  make 10 cirlces both ways;      Shoulder Exercises: Pulleys   Flexion 2 minutes   ABduction 2 minutes   ABduction Limitations painfree range for scaption                  PT Short Term Goals - 11/23/15 1605      PT SHORT TERM GOAL #1   Title Pt will be I with initial HEP   Time 3   Period Weeks   Status Achieved     PT SHORT TERM GOAL #2   Title Pt will demo improved shoulder abdct to 102 degrees to assist with dressing   Time 3   Period Weeks   Status Achieved     PT SHORT TERM GOAL #3    Title Pt will demo improved L shoulder IR to T7 to assist with putting on bra   Time 3   Period Weeks   Status On-going  T10     PT SHORT TERM GOAL #4   Title Pt will report improved L shoulder pain to 4/10 consistently to assist with work duties   Time 3   Period Weeks   Status Achieved           PT Long Term Goals - 11/02/15 0926      PT LONG TERM GOAL #1   Title Pt will demonstrate improved L shoulder strength >/= 4+/5 all major muscle groups to assist with household chores   Time 8   Period Weeks   Status New     PT LONG TERM GOAL #2   Title Pt will be able to hold laundry basket with 75% less difficulty   Time 8   Period Weeks   Status New     PT LONG TERM GOAL #3   Title pt will be able to dress/bathe with no difficulty or compensations 100% of the time   Time 8   Period Weeks   Status New     PT LONG TERM GOAL #4   Title Pt will demo improved L shoulder AROM all planes to assist with reaching for cup   Time 8   Period Weeks   Status New               Plan - 11/23/15 1610    Clinical Impression Statement Patient has increased left shoulder AROM.  Patient was able to do stabilization exercises with left shoulder with greater ease.  Patient moved her arm quickly with extra pain but after therapy she felt good.  Patient will benefit form skilled therapy to reduce pain and increase strength.    Rehab Potential Excellent   Clinical Impairments Affecting Rehab Potential pain   PT Frequency 2x / week   PT Duration 8 weeks   PT Treatment/Interventions ADLs/Self Care Home Management;Cryotherapy;Electrical Stimulation;Iontophoresis 4mg /ml Dexamethasone;Moist Heat;Ultrasound;Therapeutic exercise;Therapeutic activities;Functional mobility training;Neuromuscular re-education;Patient/family education;Manual techniques;Passive range of motion;Taping;Vasopneumatic Device   PT Next Visit Plan write note for MD   PT Home Exercise Plan progress as needed   Consulted  and Agree with Plan of Care Patient      Patient will benefit from skilled therapeutic intervention in order to improve the following deficits and impairments:  Decreased activity tolerance, Decreased mobility, Decreased range  of motion, Decreased strength, Increased edema  Visit Diagnosis: Muscle weakness (generalized)  Pain in left shoulder     Problem List Patient Active Problem List   Diagnosis Date Noted  . Dislocation of left shoulder joint 10/21/2015    Earlie Counts, PT 11/23/15 4:13 PM   [facility] [address] [place], [place], [address] Phone: [phone]   Fax:  [phone]  Name: Sandra Hill MRN: [id number] Date of Birth: 1970/10/06

## 2015-11-25 ENCOUNTER — Ambulatory Visit: Payer: 59 | Admitting: Physical Therapy

## 2015-11-25 DIAGNOSIS — M25512 Pain in left shoulder: Secondary | ICD-10-CM

## 2015-11-25 DIAGNOSIS — M6281 Muscle weakness (generalized): Secondary | ICD-10-CM | POA: Diagnosis not present

## 2015-11-25 DIAGNOSIS — R293 Abnormal posture: Secondary | ICD-10-CM

## 2015-11-25 NOTE — Therapy (Signed)
[facility] [address] [place], [place], [address] Phone: [phone]   Fax:  [phone]  Physical Therapy Treatment  Patient Details  Name: Sandra Hill MRN: [id number] Date of Birth: 22-Jan-1971 Referring Provider: Barbaraann Barthel  Encounter Date: 11/25/2015      PT End of Session - 11/25/15 1601    Visit Number 5   Number of Visits 16   Date for PT Re-Evaluation 12/28/15   PT Start Time [id number]   PT Stop Time 1608   PT Time Calculation (min) 38 min   Activity Tolerance Patient tolerated treatment well      Past Medical History:  Diagnosis Date  . Anxiety   . Depression   . Hypertension     No past surgical history on file.  There were no vitals filed for this visit.      Subjective Assessment - 11/25/15 1534    Subjective I'm a little achy from Tuesday, as if I had gone to the gym.     Currently in Pain? Yes   Pain Score 1    Pain Location Shoulder   Pain Orientation Left   Pain Type Acute pain   Pain Onset 1 to 4 weeks ago   Pain Frequency Constant   Aggravating Factors  hooking bra; putting hair in pony tail;  taking care of 3 dogs            [facility] PT Assessment - 11/25/15 0001      AROM   Left Shoulder Flexion 150 Degrees   Left Shoulder ABduction 125 Degrees   Left Shoulder Internal Rotation --  T8                     [facility] Adult PT Treatment/Exercise - 11/25/15 0001      Elbow Exercises   Elbow Flexion Strengthening;Left;15 reps;Theraband;Standing   Theraband Level (Elbow Flexion) Level 1 (Yellow)   Elbow Extension Strengthening;Left;15 reps;Theraband;Standing   Theraband Level (Elbow Extension) Level 1 (Yellow)     Shoulder Exercises: Supine   Other Supine Exercises supine left shoulder at 90 degrees flexion oscilating arm in midrange side to side, up /down, Cw/CC 2 sets of 10 and ER/IR with arm at side;  ABCs   Other Supine Exercises serratus punch 15x     Shoulder Exercises: Standing   External Rotation Strengthening;Left;15 reps;Theraband   Theraband Level (Shoulder External Rotation) Level 1 (Yellow)   Internal Rotation Strengthening;Left;15 reps;Theraband   Theraband Level (Shoulder Internal Rotation) Level 1 (Yellow)   Extension Strengthening;Left;15 reps;Theraband   Theraband Level (Shoulder Extension) Level 1 (Yellow)   Row Left;Strengthening;15 reps;Theraband   Theraband Level (Shoulder Row) Level 1 (Yellow)   Other Standing Exercises ball roll on mat 15x; up railing 15x   Other Standing Exercises wall push ups 15x                PT Education - 11/25/15 1601    Education provided Yes   Education Details yellow band rockwoods   Person(s) Educated Patient   Methods Demonstration;Explanation;Handout   Comprehension Verbalized understanding;Returned demonstration          PT Short Term Goals - 11/25/15 1606      PT SHORT TERM GOAL #1   Title Pt will be I with initial HEP   Status Achieved     PT SHORT TERM GOAL #2   Title Pt will demo improved shoulder abdct to 102 degrees to assist with dressing   Status  Achieved     PT SHORT TERM GOAL #3   Title Pt will demo improved L shoulder IR to T7 to assist with putting on bra   Time 3   Period Weeks   Status On-going     PT SHORT TERM GOAL #4   Title Pt will report improved L shoulder pain to 4/10 consistently to assist with work duties   Status Achieved           PT Long Term Goals - 11/25/15 1606      PT LONG TERM GOAL #1   Title Pt will demonstrate improved L shoulder strength >/= 4+/5 all major muscle groups to assist with household chores   Time 8   Period Weeks   Status On-going     PT LONG TERM GOAL #2   Title Pt will be able to hold laundry basket with 75% less difficulty   Time 8   Period Weeks   Status On-going     PT LONG TERM GOAL #3   Title pt will be able to dress/bathe with no difficulty or compensations 100% of the time   Time 8    Period Weeks   Status On-going     PT LONG TERM GOAL #4   Title Pt will demo improved L shoulder AROM all planes to assist with reaching for cup   Time 8   Period Weeks   Status On-going               Plan - 11/25/15 1601    Clinical Impression Statement Improving left shoulder AROM, function and strength.  Pain intensity improving as well but still exacerbated with overhead reaching and behind the back motions.  Able to progress with low level strengthening without pain exacerbation.  Therapist closely monitoring response with all interventions.     PT Next Visit Plan see how MD appt went;  review Rockwoods with yellow band as needed;  add prone ROM      Patient will benefit from skilled therapeutic intervention in order to improve the following deficits and impairments:     Visit Diagnosis: Muscle weakness (generalized)  Pain in left shoulder  Abnormal posture     Problem List Patient Active Problem List   Diagnosis Date Noted  . Dislocation of left shoulder joint 10/21/2015   Ruben Im, PT 11/25/15 4:10 PM Phone: [phone] Fax: [phone] Alvera Singh 11/25/2015, 4:10 PM  [facility] [address], [place], [place], [address] Phone: [phone]   Fax:  [phone]  Name: Sandra Hill MRN: [id number] Date of Birth: Nov 02, 1970

## 2015-11-30 ENCOUNTER — Ambulatory Visit: Payer: 59 | Admitting: Physical Therapy

## 2015-11-30 ENCOUNTER — Encounter: Payer: Self-pay | Admitting: Physical Therapy

## 2015-11-30 DIAGNOSIS — R293 Abnormal posture: Secondary | ICD-10-CM | POA: Diagnosis not present

## 2015-11-30 DIAGNOSIS — M25512 Pain in left shoulder: Secondary | ICD-10-CM | POA: Diagnosis not present

## 2015-11-30 DIAGNOSIS — M6281 Muscle weakness (generalized): Secondary | ICD-10-CM

## 2015-11-30 NOTE — Therapy (Signed)
[facility] [address] [place], [place], [address] Phone: [phone]   Fax:  [phone]  Physical Therapy Treatment  Patient Details  Name: Sandra Hill MRN: [id number] Date of Birth: May 12, 1970 Referring Kaetlin Bullen: Barbaraann Barthel  Encounter Date: 11/30/2015      PT End of Session - 11/30/15 1652    Visit Number 6   Number of Visits 16   Date for PT Re-Evaluation 12/28/15   PT Start Time [id number]   PT Stop Time 1654   PT Time Calculation (min) 39 min   Activity Tolerance Patient tolerated treatment well   Behavior During Therapy [facility] for tasks assessed/performed      Past Medical History:  Diagnosis Date  . Anxiety   . Depression   . Hypertension     History reviewed. No pertinent surgical history.  There were no vitals filed for this visit.      Subjective Assessment - 11/30/15 1627    Subjective doctor said I am doing well and no restrictions   Pertinent History Pt is a 45 YO L shoulder dislocation when putting drops in dogs outs with L hand anchored on ground when it slid laterally causing shoulder dislocation. XRays only performed at hospital. MD orders with sling 1-2 weeks as of 10/19/15. Relocation at ER. Pt reports "pins and needles" in AM only. Pt now with decreased shoulder ROM, strength L and with overall decreased mobility.   How long can you sit comfortably? none   How long can you stand comfortably? none   How long can you walk comfortably? none   Patient Stated Goals to return to my daily routine of household chores and putting on my bra normally   Currently in Pain? No/denies                         [facility] Adult PT Treatment/Exercise - 11/30/15 0001      Elbow Exercises   Elbow Flexion Strengthening;Left;15 reps;Theraband;Standing;Other (comment)  2 sets   Theraband Level (Elbow Flexion) Level 2 (Red)   Elbow Extension Strengthening;Left;15 reps;Theraband;Standing   Theraband  Level (Elbow Extension) Level 2 (Red)  30x     Shoulder Exercises: Prone   Flexion Strengthening;Left;20 reps   Extension Both;Strengthening;20 reps  vc to ssqueeze scap and depress   Horizontal ABduction 1 Left;Strengthening;10 reps  vc to squeeze scapula     Shoulder Exercises: Standing   ABduction Strengthening;Both  30 times facing the wall   Row Strengthening;Both;10 reps;Other (comment)  3 sets   Row Weight (lbs) 25  tower   Other Standing Exercises roll red ball up wall with 2 hands 10x;    Other Standing Exercises wall push up with red ball 10x elbows to side and 10x elbows outward     Shoulder Exercises: ROM/Strengthening   UBE (Upper Arm Bike) 3 min forward/3 min backward level 0     Shoulder Exercises: Power [employer] 15 reps  front   Row Limitations 20#   Other Power Tower Exercises chest press 15# 30x                PT Education - 11/30/15 1652    Education provided No          PT Short Term Goals - 11/30/15 1631      PT SHORT TERM GOAL #3   Title Pt will demo improved L shoulder IR to T7 to assist with putting on bra  Time 3   Period Weeks   Status Achieved  reach to T7     PT SHORT TERM GOAL #4   Title Pt will report improved L shoulder pain to 4/10 consistently to assist with work duties   Time 3   Status Achieved           PT Long Term Goals - 11/30/15 1632      PT LONG TERM GOAL #1   Title Pt will demonstrate improved L shoulder strength >/= 4+/5 all major muscle groups to assist with household chores   Time 8   Period Weeks   Status On-going     PT LONG TERM GOAL #2   Title Pt will be able to hold laundry basket with 75% less difficulty   Time 8   Period Weeks   Status On-going  do not trust holding to go up stairs     PT LONG TERM GOAL #3   Title pt will be able to dress/bathe with no difficulty or compensations 100% of the time   Time 8   Period Weeks   Status Achieved     PT LONG TERM GOAL #4   Title Pt  will demo improved L shoulder AROM all planes to assist with reaching for cup   Time 8   Period Weeks   Status Achieved               Plan - 11/30/15 1652    Clinical Impression Statement Patient has not pain.  Patient able to reach to T7 and it is easier to put her bra on.  Patient was able to do a full strengthening session for the left shoulder without pain.  Patient is not comfortable to hold the laundry basket while going u pthe stairs. Patient able to dress herself without difficulty. Patient will benefit form physical therapy to improve her strength.    Rehab Potential Excellent   Clinical Impairments Affecting Rehab Potential pain   PT Frequency 2x / week   PT Duration 8 weeks   PT Treatment/Interventions ADLs/Self Care Home Management;Cryotherapy;Electrical Stimulation;Iontophoresis 4mg /ml Dexamethasone;Moist Heat;Ultrasound;Therapeutic exercise;Therapeutic activities;Functional mobility training;Neuromuscular re-education;Patient/family education;Manual techniques;Passive range of motion;Taping;Vasopneumatic Device   PT Next Visit Plan progress with weights   PT Home Exercise Plan progress as needed   Consulted and Agree with Plan of Care Patient      Patient will benefit from skilled therapeutic intervention in order to improve the following deficits and impairments:  Decreased activity tolerance, Decreased mobility, Decreased range of motion, Decreased strength, Increased edema  Visit Diagnosis: Muscle weakness (generalized)  Pain in left shoulder  Abnormal posture     Problem List Patient Active Problem List   Diagnosis Date Noted  . Dislocation of left shoulder joint 10/21/2015    Earlie Counts, PT 11/30/15 4:58 PM    [facility] [address] [place], [place], [address] Phone: [phone]   Fax:  [phone]  Name: Sandra Hill MRN: [id number] Date of Birth: 1970-06-30

## 2015-12-01 DIAGNOSIS — D229 Melanocytic nevi, unspecified: Secondary | ICD-10-CM | POA: Diagnosis not present

## 2015-12-02 ENCOUNTER — Ambulatory Visit: Payer: 59 | Admitting: Physical Therapy

## 2015-12-02 ENCOUNTER — Encounter: Payer: Self-pay | Admitting: Physical Therapy

## 2015-12-02 DIAGNOSIS — M6281 Muscle weakness (generalized): Secondary | ICD-10-CM

## 2015-12-02 DIAGNOSIS — R293 Abnormal posture: Secondary | ICD-10-CM | POA: Diagnosis not present

## 2015-12-02 DIAGNOSIS — M25512 Pain in left shoulder: Secondary | ICD-10-CM

## 2015-12-02 NOTE — Therapy (Signed)
[facility] [address] [place], [place], [address] Phone: [phone]   Fax:  [phone]  Physical Therapy Treatment  Patient Details  Name: Sandra Hill MRN: [id number] Date of Birth: December 01, 1970 Referring Provider: Barbaraann Barthel  Encounter Date: 12/02/2015      PT End of Session - 12/02/15 1650    Visit Number 7   Number of Visits 16   Date for PT Re-Evaluation 12/28/15   PT Start Time [id number]   PT Stop Time 1645   PT Time Calculation (min) 30 min   Activity Tolerance Patient tolerated treatment well   Behavior During Therapy [facility] for tasks assessed/performed      Past Medical History:  Diagnosis Date  . Anxiety   . Depression   . Hypertension     History reviewed. No pertinent surgical history.  There were no vitals filed for this visit.      Subjective Assessment - 12/02/15 1624    Subjective I slept on my right shoulder so I have pain in right.  If I move my left shoulder roughly I get a twinge.    Pertinent History Pt is a 45 YO L shoulder dislocation when putting drops in dogs outs with L hand anchored on ground when it slid laterally causing shoulder dislocation. XRays only performed at hospital. MD orders with sling 1-2 weeks as of 10/19/15. Relocation at ER. Pt reports "pins and needles" in AM only. Pt now with decreased shoulder ROM, strength L and with overall decreased mobility.   Limitations Lifting;Writing;House hold activities   How long can you sit comfortably? none   How long can you stand comfortably? none   How long can you walk comfortably? none   Diagnostic tests Xrays only   Patient Stated Goals to return to my daily routine of household chores and putting on my bra normally   Currently in Pain? Yes   Pain Score 1    Pain Location Shoulder   Pain Orientation Right;Left   Pain Descriptors / Indicators Aching   Pain Type Acute pain   Pain Onset 1 to 4 weeks ago   Pain Frequency  Intermittent   Aggravating Factors  walking the dog that pulls my arm.    Pain Relieving Factors rest   Multiple Pain Sites No                         [facility] Adult PT Treatment/Exercise - 12/02/15 0001      Shoulder Exercises: Prone   Flexion Strengthening;Left;20 reps   Extension Both;Strengthening;20 reps  vc to ssqueeze scap and depress   External Rotation Strengthening;Both;10 reps   Horizontal ABduction 1 Left;Strengthening;10 reps  vc to squeeze scapula   Other Prone Exercises plank on elbows hold 15 sec 3 times     Shoulder Exercises: Standing   Row Strengthening;Both;10 reps  3 sets 25# with pulleys   Other Standing Exercises roll red ball up wall with 2 hands 10x;    Other Standing Exercises wall push up with red ball 20x elbows to side; push into ball and move side to side and up/down 20x each      Shoulder Exercises: ROM/Strengthening   UBE (Upper Arm Bike) 3 min forward/3 min backward level 1     Shoulder Exercises: Power [employer] 10 reps  front; 3 sets   Row Limitations 25#   Other Power Tower Exercises chest press 15# 30x  PT Education - 12/02/15 1650    Education provided No          PT Short Term Goals - 11/30/15 1631      PT SHORT TERM GOAL #3   Title Pt will demo improved L shoulder IR to T7 to assist with putting on bra   Time 3   Period Weeks   Status Achieved  reach to T7     PT SHORT TERM GOAL #4   Title Pt will report improved L shoulder pain to 4/10 consistently to assist with work duties   Time 3   Status Achieved           PT Long Term Goals - 11/30/15 1632      PT LONG TERM GOAL #1   Title Pt will demonstrate improved L shoulder strength >/= 4+/5 all major muscle groups to assist with household chores   Time 8   Period Weeks   Status On-going     PT LONG TERM GOAL #2   Title Pt will be able to hold laundry basket with 75% less difficulty   Time 8   Period Weeks   Status On-going   do not trust holding to go up stairs     PT LONG TERM GOAL #3   Title pt will be able to dress/bathe with no difficulty or compensations 100% of the time   Time 8   Period Weeks   Status Achieved     PT LONG TERM GOAL #4   Title Pt will demo improved L shoulder AROM all planes to assist with reaching for cup   Time 8   Period Weeks   Status Achieved               Plan - 12/02/15 1651    Clinical Impression Statement Patient has no pain in left shoulder.  Patient has no difficulty with using left arm for her hair or dressing.  Patient slept on her right shoulder so she has  alittle pain.  Patient is able to exercise with weight with no increase in pain. Patient will benefit from physical therapy to increase strength of right shoulder.    Rehab Potential Excellent   Clinical Impairments Affecting Rehab Potential pain   PT Frequency 2x / week   PT Duration 8 weeks   PT Treatment/Interventions ADLs/Self Care Home Management;Cryotherapy;Electrical Stimulation;Iontophoresis 4mg /ml Dexamethasone;Moist Heat;Ultrasound;Therapeutic exercise;Therapeutic activities;Functional mobility training;Neuromuscular re-education;Patient/family education;Manual techniques;Passive range of motion;Taping;Vasopneumatic Device   PT Next Visit Plan progress with weights; If patient is doing well she may be able to be discharged   PT Home Exercise Plan progress as needed   Consulted and Agree with Plan of Care Patient      Patient will benefit from skilled therapeutic intervention in order to improve the following deficits and impairments:  Decreased activity tolerance, Decreased mobility, Decreased range of motion, Decreased strength, Increased edema  Visit Diagnosis: Muscle weakness (generalized)  Pain in left shoulder     Problem List Patient Active Problem List   Diagnosis Date Noted  . Dislocation of left shoulder joint 10/21/2015    Earlie Counts, PT 12/02/15 4:55 PM    [facility] [address] [place], [place], [address] Phone: [phone]   Fax:  [phone]  Name: Sandra Hill MRN: [id number] Date of Birth: November 02, 1970

## 2015-12-07 ENCOUNTER — Ambulatory Visit: Payer: 59 | Admitting: Physical Therapy

## 2015-12-07 DIAGNOSIS — M6281 Muscle weakness (generalized): Secondary | ICD-10-CM | POA: Diagnosis not present

## 2015-12-07 DIAGNOSIS — M25512 Pain in left shoulder: Secondary | ICD-10-CM | POA: Diagnosis not present

## 2015-12-07 DIAGNOSIS — R293 Abnormal posture: Secondary | ICD-10-CM

## 2015-12-07 NOTE — Therapy (Signed)
[facility] [address] [place], [place], [address] Phone: [phone]   Fax:  [phone]  Physical Therapy Treatment  Patient Details  Name: Sandra Hill MRN: [id number] Date of Birth: Apr 19, 1971 Referring Provider: Barbaraann Barthel  Encounter Date: 12/07/2015      PT End of Session - 12/07/15 1640    Visit Number 8   Number of Visits 16   Date for PT Re-Evaluation 12/28/15   PT Start Time 1620   PT Stop Time 1700   PT Time Calculation (min) 40 min   Activity Tolerance Patient tolerated treatment well      Past Medical History:  Diagnosis Date  . Anxiety   . Depression   . Hypertension     No past surgical history on file.  There were no vitals filed for this visit.      Subjective Assessment - 12/07/15 1621    Subjective Minor soreness following last session but not too bad.  Reports good compliance with HEP.  It feels weak sometimes and a little stiff in the AM but then resolves "when I get going."  I still modify when taking my shirt off.     Currently in Pain? No/denies   Pain Score 0-No pain   Pain Location Shoulder   Pain Orientation Left            [facility] PT Assessment - 12/07/15 0001      AROM   Left Shoulder Flexion 153 Degrees   Left Shoulder ABduction 146 Degrees   Left Shoulder Internal Rotation --  T8     Strength   Overall Strength --  Glenohumeral strength 4/5                     [facility] Adult PT Treatment/Exercise - 12/07/15 0001      Shoulder Exercises: Prone   Other Prone Exercises over green ball:  1# Is, Ys Ts   Other Prone Exercises over ball WB'ing on UEs small walk outs     Shoulder Exercises: Standing   Other Standing Exercises Body Blade 3 positions:  2x 30 sec each     Shoulder Exercises: ROM/Strengthening   UBE (Upper Arm Bike) 3 min forward/3 min backward level 1     Shoulder Exercises: Power Warden/ranger Exercises standing row 25#                    PT Short Term Goals - 12/07/15 1641      PT SHORT TERM GOAL #1   Title Pt will be I with initial HEP   Status Achieved     PT SHORT TERM GOAL #2   Title Pt will demo improved shoulder abdct to 102 degrees to assist with dressing   Status Achieved     PT SHORT TERM GOAL #3   Title Pt will demo improved L shoulder IR to T7 to assist with putting on bra   Status Achieved     PT SHORT TERM GOAL #4   Title Pt will report improved L shoulder pain to 4/10 consistently to assist with work duties   Status Achieved           PT Long Term Goals - 12/07/15 1641      PT LONG TERM GOAL #1   Title Pt will demonstrate improved L shoulder strength >/= 4+/5 all major muscle groups to assist with household chores   Time 8  Period Weeks   Status On-going     PT LONG TERM GOAL #2   Title Pt will be able to hold laundry basket with 75% less difficulty   Time 8   Period Weeks   Status On-going     PT LONG TERM GOAL #3   Title pt will be able to dress/bathe with no difficulty or compensations 100% of the time   Status Achieved     PT LONG TERM GOAL #4   Title Pt will demo improved L shoulder AROM all planes to assist with reaching for cup   Status Achieved               Plan - 12/07/15 1643    Clinical Impression Statement Patient is progressing very well with shoulder AROM and strength.  She is able to use her arm for home and work ADLs but still modifies to avoid excessive lifting and taking off a pullover shirt.  Progressing well with rehab goals and may be ready for discharge in 1-2 visits.  Therapist closely monitoring  for pain and proper technique.     PT Next Visit Plan progress with weights; If patient is doing well she may be able to be discharged;  check FOTO      Patient will benefit from skilled therapeutic intervention in order to improve the following deficits and impairments:     Visit Diagnosis: Muscle weakness  (generalized)  Pain in left shoulder  Abnormal posture     Problem List Patient Active Problem List   Diagnosis Date Noted  . Dislocation of left shoulder joint 10/21/2015    Alvera Singh 12/07/2015, 4:52 PM  [facility] [address], [place], [place], [address] Phone: [phone]   Fax:  [phone]  Name: Sandra Hill MRN: [id number] Date of Birth: 1970/07/03

## 2015-12-07 NOTE — Therapy (Signed)
[facility] [address] [place], [place], [address] Phone: [phone]   Fax:  [phone]  Physical Therapy Treatment  Patient Details  Name: Sandra Hill MRN: [id number] Date of Birth: [birth year] Referring Provider: Barbaraann Barthel  Encounter Date: 12/07/2015      PT End of Session - 12/07/15 1640    Visit Number 8   Number of Visits 16   Date for PT Re-Evaluation 12/28/15   PT Start Time 1620   PT Stop Time 1700   PT Time Calculation (min) 40 min   Activity Tolerance Patient tolerated treatment well      Past Medical History:  Diagnosis Date  . Anxiety   . Depression   . Hypertension     No past surgical history on file.  There were no vitals filed for this visit.      Subjective Assessment - 12/07/15 1621    Subjective Minor soreness following last session but not too bad.  Reports good compliance with HEP.  It feels weak sometimes and a little stiff in the AM but then resolves "when I get going."  I still modify when taking my shirt off.     Currently in Pain? No/denies   Pain Score 0-No pain   Pain Location Shoulder   Pain Orientation Left            [facility] PT Assessment - 12/07/15 0001      AROM   Left Shoulder Flexion 153 Degrees   Left Shoulder ABduction 146 Degrees   Left Shoulder Internal Rotation --  T8     Strength   Overall Strength --  Glenohumeral strength 4/5                     [facility] Adult PT Treatment/Exercise - 12/07/15 0001      Shoulder Exercises: Prone   Other Prone Exercises over green ball:  1# Is, Ys Ts   Other Prone Exercises over ball WB'ing on UEs small walk outs     Shoulder Exercises: Standing   Extension --  D1D2 extension 15x blue band   Row Strengthening;Both;10 reps  3 sets 25# with pulleys   Row Weight (lbs) 25  tower   Other Standing Exercises Body Blade 3 positions:  2x 30 sec each   Other Standing Exercises red band flexion on wall  20x     Shoulder Exercises: ROM/Strengthening   UBE (Upper Arm Bike) 3 min forward/3 min backward level 1     Shoulder Exercises: Power Warden/ranger Exercises standing row 25#                   PT Short Term Goals - 12/07/15 1641      PT SHORT TERM GOAL #1   Title Pt will be I with initial HEP   Status Achieved     PT SHORT TERM GOAL #2   Title Pt will demo improved shoulder abdct to 102 degrees to assist with dressing   Status Achieved     PT SHORT TERM GOAL #3   Title Pt will demo improved L shoulder IR to T7 to assist with putting on bra   Status Achieved     PT SHORT TERM GOAL #4   Title Pt will report improved L shoulder pain to 4/10 consistently to assist with work duties   Status Achieved           PT Long Term  Goals - 12/07/15 1641      PT LONG TERM GOAL #1   Title Pt will demonstrate improved L shoulder strength >/= 4+/5 all major muscle groups to assist with household chores   Time 8   Period Weeks   Status On-going     PT LONG TERM GOAL #2   Title Pt will be able to hold laundry basket with 75% less difficulty   Time 8   Period Weeks   Status On-going     PT LONG TERM GOAL #3   Title pt will be able to dress/bathe with no difficulty or compensations 100% of the time   Status Achieved     PT LONG TERM GOAL #4   Title Pt will demo improved L shoulder AROM all planes to assist with reaching for cup   Status Achieved               Plan - 12/07/15 1643    Clinical Impression Statement Patient is progressing very well with shoulder AROM and strength.  She is able to use her arm for home and work ADLs but still modifies to avoid excessive lifting and taking off a pullover shirt.  Progressing well with rehab goals and may be ready for discharge in 1-2 visits.  Therapist closely monitoring  for pain and proper technique.     PT Next Visit Plan progress with weights; If patient is doing well she may be able to be discharged;   check FOTO      Patient will benefit from skilled therapeutic intervention in order to improve the following deficits and impairments:     Visit Diagnosis: Muscle weakness (generalized)  Pain in left shoulder  Abnormal posture     Problem List Patient Active Problem List   Diagnosis Date Noted  . Dislocation of left shoulder joint 10/21/2015   Ruben Im, PT 12/07/15 4:56 PM Phone: [phone] Fax: [phone] Alvera Singh 12/07/2015, 4:56 PM  [facility] [address] [place], [place], [address] Phone: [phone]   Fax:  [phone]  Name: Sandra Hill MRN: [id number] Date of Birth: 09-21-1970

## 2015-12-09 ENCOUNTER — Ambulatory Visit: Payer: 59 | Admitting: Physical Therapy

## 2015-12-09 DIAGNOSIS — M25512 Pain in left shoulder: Secondary | ICD-10-CM

## 2015-12-09 DIAGNOSIS — R293 Abnormal posture: Secondary | ICD-10-CM

## 2015-12-09 DIAGNOSIS — M6281 Muscle weakness (generalized): Secondary | ICD-10-CM

## 2015-12-09 NOTE — Therapy (Signed)
[facility] [address], [place], [place], [address] Phone: [phone]   Fax:  [phone]  Physical Therapy Treatment/Discharge Summary  Patient Details  Name: Sandra Hill MRN: [id number] Date of Birth: 07/04/1970 Referring Provider: Barbaraann Barthel  Encounter Date: 12/09/2015      PT End of Session - 12/09/15 1452    Visit Number 9   Number of Visits 16   Date for PT Re-Evaluation 12/28/15   PT Start Time 4765   PT Stop Time 1523   PT Time Calculation (min) 38 min   Activity Tolerance Patient tolerated treatment well      Past Medical History:  Diagnosis Date  . Anxiety   . Depression   . Hypertension     No past surgical history on file.  There were no vitals filed for this visit.      Subjective Assessment - 12/09/15 1446    Subjective I was a little sore after last session but not that much.  Shoulder is not painful anymore, just delayed onset muscle soreness from exercise.     Currently in Pain? No/denies   Pain Score 0-No pain   Pain Location Shoulder   Pain Orientation Left   Pain Type Acute pain            [facility] PT Assessment - 12/09/15 0001      Observation/Other Assessments   Focus on Therapeutic Outcomes (FOTO)  23% limitation     AROM   Left Shoulder Flexion 160 Degrees   Left Shoulder ABduction 153 Degrees   Left Shoulder Internal Rotation --  T8     Strength   Overall Strength --  4+/5 glenohumeral and scapular muscles                     [facility] Adult PT Treatment/Exercise - 12/09/15 0001      Shoulder Exercises: Standing   External Rotation Strengthening;Left;20 reps;Theraband   Internal Rotation Strengthening;Left;20 reps;Theraband   Theraband Level (Shoulder Internal Rotation) Level 2 (Red)   Flexion Strengthening;Left;20 reps  2# plyo ball overhead chops, diagonals right/left 10x each   Extension Strengthening;Left;20 reps;Theraband   Row  Strengthening;Left;20 reps;Theraband   Theraband Level (Shoulder Row) Level 2 (Red)   Other Standing Exercises red ball walk 10x   Other Standing Exercises middle trap yellow band resisted wall walk     Shoulder Exercises: ROM/Strengthening   UBE (Upper Arm Bike) 3 min forward/3 min backward level 1   Plank 5 reps  leaning on counter     Shoulder Exercises: Power Warden/ranger Exercises chest press 15# 30x                  PT Short Term Goals - 12/09/15 1451      PT SHORT TERM GOAL #1   Title Pt will be I with initial HEP   Status Achieved     PT SHORT TERM GOAL #2   Title Pt will demo improved shoulder abdct to 102 degrees to assist with dressing   Status Achieved     PT SHORT TERM GOAL #3   Title Pt will demo improved L shoulder IR to T7 to assist with putting on bra   Status Achieved     PT SHORT TERM GOAL #4   Title Pt will report improved L shoulder pain to 4/10 consistently to assist with work duties   Status Achieved  PT Long Term Goals - 12/09/15 1451      PT LONG TERM GOAL #1   Title Pt will demonstrate improved L shoulder strength >/= 4+/5 all major muscle groups to assist with household chores   Status Achieved     PT LONG TERM GOAL #2   Title Pt will be able to hold laundry basket with 75% less difficulty   Status Achieved     PT LONG TERM GOAL #3   Title pt will be able to dress/bathe with no difficulty or compensations 100% of the time   Status Achieved     PT LONG TERM GOAL #4   Title Pt will demo improved L shoulder AROM all planes to assist with reaching for cup   Status Achieved               Plan - 12/09/15 1453    Clinical Impression Statement The patient has progressed well with pain reduction, ROM and strength.  Her FOTO functional outcome score has significantly improved form 58% limitation to 23% limitation.  She has met all rehab goals.  Discharge from PT.        Patient will benefit from  skilled therapeutic intervention in order to improve the following deficits and impairments:     Visit Diagnosis: Muscle weakness (generalized)  Pain in left shoulder  Abnormal posture     Problem List Patient Active Problem List   Diagnosis Date Noted  . Dislocation of left shoulder joint 10/21/2015   PHYSICAL THERAPY DISCHARGE SUMMARY  Visits from Start of Care: 9  Current functional level related to goals / functional outcomes: See clinical impression statement.  All rehab goals met.   Remaining deficits: As above    Education / Equipment: Comprehensive HEP progression Plan: Patient agrees to discharge.  Patient goals were met. Patient is being discharged due to meeting the stated rehab goals.  ?????        Ruben Im, PT 12/09/15 3:31 PM Phone: [phone] Fax: [phone]   Alvera Singh 12/09/2015, 3:18 PM  [facility] [address], [place], [place], [address] Phone: [phone]   Fax:  [phone]  Name: Sandra Hill MRN: [id number] Date of Birth: 1970-09-16

## 2015-12-13 DIAGNOSIS — D225 Melanocytic nevi of trunk: Secondary | ICD-10-CM | POA: Diagnosis not present

## 2015-12-13 DIAGNOSIS — D485 Neoplasm of uncertain behavior of skin: Secondary | ICD-10-CM | POA: Diagnosis not present

## 2015-12-13 DIAGNOSIS — D0359 Melanoma in situ of other part of trunk: Secondary | ICD-10-CM | POA: Diagnosis not present

## 2015-12-14 ENCOUNTER — Encounter: Payer: 59 | Admitting: Physical Therapy

## 2015-12-16 ENCOUNTER — Encounter: Payer: 59 | Admitting: Physical Therapy

## 2015-12-17 MED FILL — SERTRALINE HCL 100 MG TAB: 100 | 30 days supply | Qty: 45 | Fill #1

## 2015-12-17 MED FILL — traZODone HCL 50 MG TABS: 50 | 30 days supply | Qty: 30 | Fill #1

## 2015-12-20 DIAGNOSIS — D0359 Melanoma in situ of other part of trunk: Secondary | ICD-10-CM | POA: Diagnosis not present

## 2015-12-20 DIAGNOSIS — D485 Neoplasm of uncertain behavior of skin: Secondary | ICD-10-CM | POA: Diagnosis not present

## 2015-12-20 DIAGNOSIS — L98429 Non-pressure chronic ulcer of back with unspecified severity: Secondary | ICD-10-CM | POA: Diagnosis not present

## 2016-01-17 MED FILL — SERTRALINE HCL 100 MG TAB: 100 | 30 days supply | Qty: 45 | Fill #2

## 2016-01-17 MED FILL — traZODone HCL 50 MG TABS: 50 | 30 days supply | Qty: 30 | Fill #2

## 2016-01-18 DIAGNOSIS — F322 Major depressive disorder, single episode, severe without psychotic features: Secondary | ICD-10-CM | POA: Diagnosis not present

## 2016-01-18 DIAGNOSIS — I10 Essential (primary) hypertension: Secondary | ICD-10-CM | POA: Diagnosis not present

## 2016-01-20 DIAGNOSIS — E288 Other ovarian dysfunction: Secondary | ICD-10-CM | POA: Diagnosis not present

## 2016-01-20 DIAGNOSIS — Z319 Encounter for procreative management, unspecified: Secondary | ICD-10-CM | POA: Diagnosis not present

## 2016-01-21 DIAGNOSIS — Z319 Encounter for procreative management, unspecified: Secondary | ICD-10-CM | POA: Diagnosis not present

## 2016-02-02 MED FILL — DOXYCYCLINE HYCLATE 100 MG: 100 | 5 days supply | Qty: 10 | Fill #0

## 2016-02-03 DIAGNOSIS — N7011 Chronic salpingitis: Secondary | ICD-10-CM | POA: Diagnosis not present

## 2016-02-03 DIAGNOSIS — N971 Female infertility of tubal origin: Secondary | ICD-10-CM | POA: Diagnosis not present

## 2016-02-17 MED FILL — traZODone HCL 50 MG TABS: 50 | 30 days supply | Qty: 30 | Fill #0

## 2016-02-17 MED FILL — SERTRALINE HCL 100 MG TAB: 100 | 30 days supply | Qty: 30 | Fill #1

## 2016-03-07 DIAGNOSIS — E288 Other ovarian dysfunction: Secondary | ICD-10-CM | POA: Diagnosis not present

## 2016-03-07 DIAGNOSIS — Z319 Encounter for procreative management, unspecified: Secondary | ICD-10-CM | POA: Diagnosis not present

## 2016-03-13 MED FILL — traZODone HCL 50 MG TABS: 50 | 30 days supply | Qty: 30 | Fill #1

## 2016-03-14 MED FILL — SERTRALINE HCL 100 MG TAB: 100 | 90 days supply | Qty: 90 | Fill #0

## 2016-03-30 ENCOUNTER — Telehealth: Payer: 59 | Admitting: Physician Assistant

## 2016-03-30 DIAGNOSIS — R300 Dysuria: Secondary | ICD-10-CM

## 2016-03-30 MED ORDER — NITROFURANTOIN MONOHYD MACRO 100 MG PO CAPS: 100.0000 mg | ORAL_CAPSULE | Freq: Two times a day (BID) | ORAL | 0 refills | Status: AC

## 2016-03-30 MED FILL — NITROFURANTOIN MONO-MCR 100: 100 | 5 days supply | Qty: 10 | Fill #0

## 2016-03-30 NOTE — Progress Notes (Signed)

## 2016-03-31 ENCOUNTER — Encounter (HOSPITAL_BASED_OUTPATIENT_CLINIC_OR_DEPARTMENT_OTHER): Payer: Self-pay | Admitting: *Deleted

## 2016-03-31 NOTE — Progress Notes (Signed)
NPO AFTER MN.  ARRIVE AT 1015.  NEEDS ISTAT AND EKG.  WILL TAKE ZOLOFT AM DOS W/ SIPS OF WATER.

## 2016-04-03 DIAGNOSIS — N7011 Chronic salpingitis: Secondary | ICD-10-CM | POA: Diagnosis not present

## 2016-04-04 ENCOUNTER — Ambulatory Visit (HOSPITAL_BASED_OUTPATIENT_CLINIC_OR_DEPARTMENT_OTHER): Payer: 59 | Admitting: Certified Registered"

## 2016-04-04 ENCOUNTER — Other Ambulatory Visit: Payer: Self-pay

## 2016-04-04 ENCOUNTER — Ambulatory Visit (HOSPITAL_BASED_OUTPATIENT_CLINIC_OR_DEPARTMENT_OTHER)
Admission: RE | Admit: 2016-04-04 | Discharge: 2016-04-04 | Disposition: A | Discharge status: Home / Self Care | Payer: 59 | Source: Ambulatory Visit | Attending: Obstetrics and Gynecology | Admitting: Obstetrics and Gynecology

## 2016-04-04 ENCOUNTER — Encounter (HOSPITAL_BASED_OUTPATIENT_CLINIC_OR_DEPARTMENT_OTHER)
Admission: RE | Disposition: A | Discharge status: Home / Self Care | Payer: Self-pay | Source: Ambulatory Visit | Attending: Obstetrics and Gynecology

## 2016-04-04 ENCOUNTER — Encounter (HOSPITAL_BASED_OUTPATIENT_CLINIC_OR_DEPARTMENT_OTHER): Payer: Self-pay | Admitting: Certified Registered"

## 2016-04-04 DIAGNOSIS — I10 Essential (primary) hypertension: Secondary | ICD-10-CM | POA: Insufficient documentation

## 2016-04-04 DIAGNOSIS — N971 Female infertility of tubal origin: Secondary | ICD-10-CM | POA: Insufficient documentation

## 2016-04-04 DIAGNOSIS — K660 Peritoneal adhesions (postprocedural) (postinfection): Secondary | ICD-10-CM | POA: Diagnosis not present

## 2016-04-04 DIAGNOSIS — N7011 Chronic salpingitis: Secondary | ICD-10-CM | POA: Insufficient documentation

## 2016-04-04 DIAGNOSIS — Z419 Encounter for procedure for purposes other than remedying health state, unspecified: Secondary | ICD-10-CM

## 2016-04-04 DIAGNOSIS — F17210 Nicotine dependence, cigarettes, uncomplicated: Secondary | ICD-10-CM | POA: Insufficient documentation

## 2016-04-04 DIAGNOSIS — S43006A Unspecified dislocation of unspecified shoulder joint, initial encounter: Secondary | ICD-10-CM | POA: Diagnosis not present

## 2016-04-04 HISTORY — DX: Salpingitis and oophoritis, unspecified: N70.93

## 2016-04-04 HISTORY — PX: LAPAROSCOPY: SHX197

## 2016-04-04 HISTORY — PX: LYSIS OF ADHESION: SHX5961

## 2016-04-04 HISTORY — DX: Urinary tract infection, site not specified: N39.0

## 2016-04-04 HISTORY — DX: Reserved for concepts with insufficient information to code with codable children: IMO0002

## 2016-04-04 HISTORY — DX: Dysuria: R30.0

## 2016-04-04 HISTORY — DX: Elevated blood-pressure reading, without diagnosis of hypertension: R03.0

## 2016-04-04 LAB — POCT I-STAT 4, (NA,K, GLUC, HGB,HCT)
Glucose, Bld: 112 mg/dL — ABNORMAL HIGH (ref 65–99)
HCT: 37 % (ref 36.0–46.0)
HEMOGLOBIN: 12.6 g/dL (ref 12.0–15.0)
POTASSIUM: 3.9 mmol/L (ref 3.5–5.1)
Sodium: 140 mmol/L (ref 135–145)

## 2016-04-04 LAB — TYPE AND SCREEN
ABO/RH(D): O POS
Antibody Screen: NEGATIVE

## 2016-04-04 LAB — POCT PREGNANCY, URINE: PREG TEST UR: NEGATIVE

## 2016-04-04 LAB — ABO/RH: ABO/RH(D): O POS

## 2016-04-04 SURGERY — LAPAROSCOPY OPERATIVE
Anesthesia: General | Laterality: Right

## 2016-04-04 MED ORDER — ONDANSETRON HCL 4 MG PO TABS: 4.0000 mg | ORAL_TABLET | Freq: Three times a day (TID) | ORAL | 0 refills | Status: AC | PRN

## 2016-04-04 MED ORDER — DEXAMETHASONE SODIUM PHOSPHATE 10 MG/ML IJ SOLN
INTRAMUSCULAR | Status: AC
  Filled 2016-04-04: qty 1

## 2016-04-04 MED ORDER — LIDOCAINE 2% (20 MG/ML) 5 ML SYRINGE
INTRAMUSCULAR | Status: AC
  Filled 2016-04-04: qty 5

## 2016-04-04 MED ORDER — MEPERIDINE HCL 25 MG/ML IJ SOLN
6.2500 mg | INTRAMUSCULAR | Status: DC | PRN
  Filled 2016-04-04: qty 1

## 2016-04-04 MED ORDER — PROPOFOL 10 MG/ML IV BOLUS
INTRAVENOUS | Status: DC | PRN
  Administered 2016-04-04: 200 mg via INTRAVENOUS

## 2016-04-04 MED ORDER — METHYLENE BLUE 0.5 % INJ SOLN
INTRAVENOUS | Status: DC | PRN
  Administered 2016-04-04: 10 mL via SUBMUCOSAL

## 2016-04-04 MED ORDER — MIDAZOLAM HCL 2 MG/2ML IJ SOLN
INTRAMUSCULAR | Status: AC
  Filled 2016-04-04: qty 2

## 2016-04-04 MED ORDER — OXYCODONE-ACETAMINOPHEN 7.5-325 MG PO TABS: 1.0000 | ORAL_TABLET | ORAL | 0 refills | Status: AC | PRN

## 2016-04-04 MED ORDER — HYDROMORPHONE HCL 2 MG/ML IJ SOLN
INTRAMUSCULAR | Status: AC
  Filled 2016-04-04: qty 1

## 2016-04-04 MED ORDER — ONDANSETRON HCL 4 MG/2ML IJ SOLN
INTRAMUSCULAR | Status: AC
  Filled 2016-04-04: qty 2

## 2016-04-04 MED ORDER — FENTANYL CITRATE (PF) 100 MCG/2ML IJ SOLN
INTRAMUSCULAR | Status: DC | PRN
  Administered 2016-04-04 (×2): 25 ug via INTRAVENOUS
  Administered 2016-04-04: 50 ug via INTRAVENOUS
  Administered 2016-04-04: 25 ug via INTRAVENOUS
  Administered 2016-04-04: 50 ug via INTRAVENOUS
  Administered 2016-04-04: 25 ug via INTRAVENOUS

## 2016-04-04 MED ORDER — HYDROMORPHONE HCL 1 MG/ML IJ SOLN
0.2500 mg | INTRAMUSCULAR | Status: DC | PRN
  Administered 2016-04-04: 0.5 mg via INTRAVENOUS
  Filled 2016-04-04: qty 0.5

## 2016-04-04 MED ORDER — SUGAMMADEX SODIUM 200 MG/2ML IV SOLN
INTRAVENOUS | Status: AC
  Filled 2016-04-04: qty 2

## 2016-04-04 MED ORDER — CEFAZOLIN SODIUM-DEXTROSE 2-4 GM/100ML-% IV SOLN
INTRAVENOUS | Status: AC
  Filled 2016-04-04: qty 100

## 2016-04-04 MED ORDER — DEXAMETHASONE SODIUM PHOSPHATE 4 MG/ML IJ SOLN
INTRAMUSCULAR | Status: DC | PRN
  Administered 2016-04-04: 10 mg via INTRAVENOUS

## 2016-04-04 MED ORDER — ROCURONIUM BROMIDE 50 MG/5ML IV SOSY
PREFILLED_SYRINGE | INTRAVENOUS | Status: DC | PRN
  Administered 2016-04-04 (×3): 10 mg via INTRAVENOUS
  Administered 2016-04-04: 50 mg via INTRAVENOUS

## 2016-04-04 MED ORDER — KETOROLAC TROMETHAMINE 30 MG/ML IJ SOLN
INTRAMUSCULAR | Status: DC | PRN
  Administered 2016-04-04: 30 mg via INTRAVENOUS

## 2016-04-04 MED ORDER — LACTATED RINGERS IV SOLN
INTRAVENOUS | Status: DC
  Administered 2016-04-04: 16:00:00 via INTRAVENOUS
  Filled 2016-04-04: qty 1000

## 2016-04-04 MED ORDER — PROMETHAZINE HCL 25 MG/ML IJ SOLN
6.2500 mg | INTRAMUSCULAR | Status: DC | PRN
  Filled 2016-04-04: qty 1

## 2016-04-04 MED ORDER — DOXYCYCLINE HYCLATE 50 MG PO CAPS: 100.0000 mg | ORAL_CAPSULE | Freq: Two times a day (BID) | ORAL | 0 refills | Status: AC

## 2016-04-04 MED ORDER — MIDAZOLAM HCL 5 MG/5ML IJ SOLN
INTRAMUSCULAR | Status: DC | PRN
  Administered 2016-04-04: 2 mg via INTRAVENOUS

## 2016-04-04 MED ORDER — KETOROLAC TROMETHAMINE 30 MG/ML IJ SOLN
INTRAMUSCULAR | Status: AC
  Filled 2016-04-04: qty 1

## 2016-04-04 MED ORDER — PROPOFOL 10 MG/ML IV BOLUS
INTRAVENOUS | Status: AC
  Filled 2016-04-04: qty 20

## 2016-04-04 MED ORDER — ONDANSETRON HCL 4 MG/2ML IJ SOLN
INTRAMUSCULAR | Status: DC | PRN
  Administered 2016-04-04: 4 mg via INTRAVENOUS

## 2016-04-04 MED ORDER — CEFAZOLIN SODIUM-DEXTROSE 2-4 GM/100ML-% IV SOLN
2.0000 g | INTRAVENOUS | Status: AC
  Administered 2016-04-04: 2 g via INTRAVENOUS
  Filled 2016-04-04: qty 100

## 2016-04-04 MED ORDER — FENTANYL CITRATE (PF) 100 MCG/2ML IJ SOLN
INTRAMUSCULAR | Status: AC
  Filled 2016-04-04: qty 2

## 2016-04-04 MED ORDER — LACTATED RINGERS IV SOLN
INTRAVENOUS | Status: DC
  Administered 2016-04-04 (×2): via INTRAVENOUS
  Filled 2016-04-04: qty 1000

## 2016-04-04 MED ORDER — SUGAMMADEX SODIUM 200 MG/2ML IV SOLN
INTRAVENOUS | Status: DC | PRN
  Administered 2016-04-04: 200 mg via INTRAVENOUS

## 2016-04-04 MED ORDER — VASOPRESSIN 20 UNIT/ML IV SOLN
INTRAVENOUS | Status: DC | PRN
  Administered 2016-04-04: 15:00:00 via INTRAMUSCULAR

## 2016-04-04 MED ORDER — BUPIVACAINE-EPINEPHRINE 0.5% -1:200000 IJ SOLN
INTRAMUSCULAR | Status: DC | PRN
  Administered 2016-04-04: 9 mL

## 2016-04-04 MED ORDER — LIDOCAINE 2% (20 MG/ML) 5 ML SYRINGE
INTRAMUSCULAR | Status: DC | PRN
  Administered 2016-04-04: 200 mg via INTRAVENOUS

## 2016-04-04 MED FILL — ONDANSETRON HCL 4 MG TABLET: 4 | 6 days supply | Qty: 20 | Fill #0

## 2016-04-04 MED FILL — DOXYCYCLINE HYC 50 MG CAP: 50 | 4 days supply | Qty: 14 | Fill #0

## 2016-04-04 MED FILL — OXYCODONE-APAP 7.5/325MG: 7.5-325 | 3 days supply | Qty: 20 | Fill #0

## 2016-04-04 SURGICAL SUPPLY — 67 items
BLADE SURG 11 STRL SS (BLADE) ×4 IMPLANT
CATH ROBINSON RED A/P 16FR (CATHETERS) ×4 IMPLANT
COVER MAYO STAND STRL (DRAPES) ×4 IMPLANT
DERMABOND ADVANCED (GAUZE/BANDAGES/DRESSINGS) ×2
DERMABOND ADVANCED .7 DNX12 (GAUZE/BANDAGES/DRESSINGS) ×2 IMPLANT
DEVICE TROCAR PUNCTURE CLOSURE (ENDOMECHANICALS) IMPLANT
DRAPE UNDERBUTTOCKS STRL (DRAPE) ×4 IMPLANT
DRSG OPSITE POSTOP 3X4 (GAUZE/BANDAGES/DRESSINGS) IMPLANT
ELECT NEEDLE TIP 2.8 STRL (NEEDLE) ×4 IMPLANT
ELECT REM PT RETURN 9FT ADLT (ELECTROSURGICAL) ×4
ELECTRODE REM PT RTRN 9FT ADLT (ELECTROSURGICAL) ×2 IMPLANT
EVACUATOR SMOKE 8.L (FILTER) IMPLANT
GLOVE BIO SURGEON STRL SZ8 (GLOVE) ×8 IMPLANT
GLOVE BIOGEL PI IND STRL 8.5 (GLOVE) ×2 IMPLANT
GLOVE BIOGEL PI INDICATOR 8.5 (GLOVE) ×2
GOWN STRL REUS W/ TWL LRG LVL3 (GOWN DISPOSABLE) ×2 IMPLANT
GOWN STRL REUS W/TWL LRG LVL3 (GOWN DISPOSABLE) ×2
HOLDER FOLEY CATH W/STRAP (MISCELLANEOUS) ×4 IMPLANT
KIT ROOM TURNOVER WOR (KITS) ×4 IMPLANT
LIQUID BAND (GAUZE/BANDAGES/DRESSINGS) ×4 IMPLANT
MANIFOLD NEPTUNE II (INSTRUMENTS) IMPLANT
MANIPULATOR UTERINE 4.5 ZUMI (MISCELLANEOUS) ×4 IMPLANT
NEEDLE HYPO 25X1 1.5 SAFETY (NEEDLE) ×4 IMPLANT
NEEDLE INSUFFLATION 14GA 120MM (NEEDLE) ×4 IMPLANT
NS IRRIG 500ML POUR BTL (IV SOLUTION) ×4 IMPLANT
PACK BASIN DAY SURGERY FS (CUSTOM PROCEDURE TRAY) ×4 IMPLANT
PACK LAPAROSCOPY II (CUSTOM PROCEDURE TRAY) ×4 IMPLANT
PAD OB MATERNITY 4.3X12.25 (PERSONAL CARE ITEMS) ×4 IMPLANT
PADDING ION DISPOSABLE (MISCELLANEOUS) ×4 IMPLANT
PENCIL BUTTON HOLSTER BLD 10FT (ELECTRODE) IMPLANT
POUCH SPECIMEN RETRIEVAL 10MM (ENDOMECHANICALS) IMPLANT
SCALPEL HARMONIC ACE (MISCELLANEOUS) ×4 IMPLANT
SEALER TISSUE G2 CVD JAW 35 (ENDOMECHANICALS) IMPLANT
SEALER TISSUE G2 CVD JAW 45CM (ENDOMECHANICALS) IMPLANT
SEPRAFILM MEMBRANE 5X6 (MISCELLANEOUS) ×4 IMPLANT
SET IRRIG TUBING LAPAROSCOPIC (IRRIGATION / IRRIGATOR) ×4 IMPLANT
SHEARS HARMONIC ACE PLUS 36CM (ENDOMECHANICALS) ×4 IMPLANT
SOLUTION ANTI FOG 6CC (MISCELLANEOUS) ×4 IMPLANT
SUT MNCRL AB 4-0 PS2 18 (SUTURE) ×4 IMPLANT
SUT PROLENE 0 CT 1 30 (SUTURE) IMPLANT
SUT PROLENE 6 0 C 1 30 (SUTURE) ×4 IMPLANT
SUT VIC AB 2-0 CT1 27 (SUTURE)
SUT VIC AB 2-0 CT1 TAPERPNT 27 (SUTURE) IMPLANT
SUT VIC AB 2-0 CT2 27 (SUTURE) IMPLANT
SUT VIC AB 2-0 UR6 27 (SUTURE) IMPLANT
SUT VIC AB 4-0 SH 27 (SUTURE) ×2
SUT VIC AB 4-0 SH 27XBRD (SUTURE) ×2 IMPLANT
SUT VICRYL 0 TIES 12 18 (SUTURE) IMPLANT
SYR 20CC LL (SYRINGE) IMPLANT
SYR 30ML LL (SYRINGE) IMPLANT
SYR 3ML 23GX1 SAFETY (SYRINGE) ×4 IMPLANT
SYR 5ML LL (SYRINGE) ×4 IMPLANT
SYR CONTROL 10ML LL (SYRINGE) ×8 IMPLANT
SYR TB 1ML LL NO SAFETY (SYRINGE) ×4 IMPLANT
SYRINGE 12CC LL (MISCELLANEOUS) ×4 IMPLANT
SYS LAPSCP GELPORT 120MM (MISCELLANEOUS)
SYSTEM LAPSCP GELPORT 120MM (MISCELLANEOUS) IMPLANT
TOWEL OR 17X24 6PK STRL BLUE (TOWEL DISPOSABLE) ×8 IMPLANT
TRAY DSU PREP LF (CUSTOM PROCEDURE TRAY) ×4 IMPLANT
TRAY FOLEY CATH SILVER 14FR (SET/KITS/TRAYS/PACK) ×4 IMPLANT
TROCAR OPTI TIP 5M 100M (ENDOMECHANICALS) ×16 IMPLANT
TROCAR XCEL DIL TIP R 11M (ENDOMECHANICALS) ×4 IMPLANT
TUBE CONNECTING 12'X1/4 (SUCTIONS) ×1
TUBE CONNECTING 12X1/4 (SUCTIONS) ×3 IMPLANT
TUBING INSUFFLATION 10FT LAP (TUBING) ×4 IMPLANT
WARMER LAPAROSCOPE (MISCELLANEOUS) ×4 IMPLANT
WATER STERILE IRR 500ML POUR (IV SOLUTION) ×4 IMPLANT

## 2016-04-04 NOTE — Anesthesia Procedure Notes (Signed)
Procedure Name: Intubation Date/Time: 04/04/2016 1:32 PM Performed by: Denna Haggard D Pre-anesthesia Checklist: Patient identified, Emergency Drugs available, Suction available and Patient being monitored Patient Re-evaluated:Patient Re-evaluated prior to inductionOxygen Delivery Method: Circle system utilized Preoxygenation: Pre-oxygenation with 100% oxygen Intubation Type: IV induction Ventilation: Mask ventilation without difficulty Laryngoscope Size: Mac and 3 Grade View: Grade II Tube type: Oral Tube size: 7.0 mm Number of attempts: 1 Airway Equipment and Method: Stylet and Oral airway Placement Confirmation: ETT inserted through vocal cords under direct vision,  positive ETCO2 and breath sounds checked- equal and bilateral Secured at: 22 cm Tube secured with: Tape Dental Injury: Teeth and Oropharynx as per pre-operative assessment

## 2016-04-04 NOTE — H&P (Signed)
Sandra Hill is a 45 y.o. female , originally referred to me by Dr. Landry Mellow, for tubal occlusion and infertility.  She was diagnosed with hydrosalpinx of right tube.  Patient would like to preserve her childbearing potential.  Pertinent Gynecological History: Menses: flow is excessive with use of 3 pads or tampons on heaviest days Bleeding: dysfunctional uterine bleeding Contraception: none DES exposure: denies Blood transfusions: none Sexually transmitted diseases: no past history Last pap: normal    Menstrual History: Menarche age: 14 No LMP recorded.    Past Medical History:  Diagnosis Date  . Anxiety   . Borderline hypertension   . Depression   . Dysuria   . Salpingitis or oophoritis    chronic  . UTI (urinary tract infection)    dx 03-31-2016                    Past Surgical History:  Procedure Laterality Date  . NO PAST SURGERIES               History reviewed. No pertinent family history. No hereditary disease.  No cancer of breast, ovary, uterus.  Social History   Social History  . Marital status: Married    Spouse name: N/A  . Number of children: N/A  . Years of education: N/A   Occupational History  . Not on file.   Social History Main Topics  . Smoking status: Current Some Day Smoker    Packs/day: 0.25    Years: 15.00    Types: Cigarettes  . Smokeless tobacco: Never Used     Comment: 1pp3d  . Alcohol use 0.0 oz/week     Comment: occasional  . Drug use: No  . Sexual activity: Yes    Birth control/ protection: None   Other Topics Concern  . Not on file   Social History Narrative  . No narrative on file    No Known Allergies  No current facility-administered medications on file prior to encounter.    Current Outpatient Prescriptions on File Prior to Encounter  Medication Sig Dispense Refill  . hydrochlorothiazide (MICROZIDE) 12.5 MG capsule Take 12.5 mg by mouth as directed.     . traZODone (DESYREL) 100 MG tablet Take 100 mg by  mouth at bedtime.       Review of Systems  Constitutional: Negative.   HENT: Negative.   Eyes: Negative.   Respiratory: Negative.   Cardiovascular: Negative.   Gastrointestinal: Negative.   Genitourinary: Negative.   Musculoskeletal: Negative.   Skin: Negative.   Neurological: Negative.   Endo/Heme/Allergies: Negative.   Psychiatric/Behavioral: Negative.      Physical Exam  BP (!) 162/88   Pulse 64   Temp 98.6 F (37 C) (Oral)   Resp 18   Ht 5' 6.5" (1.689 m)   Wt 108.4 kg (239 lb)   LMP 03/10/2016 (Approximate)   SpO2 99%   BMI 38.00 kg/m  Constitutional: She is oriented to person, place, and time. She appears well-developed and well-nourished.  HENT:  Head: Normocephalic and atraumatic.  Nose: Nose normal.  Mouth/Throat: Oropharynx is clear and moist. No oropharyngeal exudate.  Eyes: Conjunctivae normal and EOM are normal. Pupils are equal, round, and reactive to light. No scleral icterus.  Neck: Normal range of motion. Neck supple. No tracheal deviation present. No thyromegaly present.  Cardiovascular: Normal rate.   Respiratory: Effort normal and breath sounds normal.  GI: Soft. Bowel sounds are normal. She exhibits no distension and no mass. There is no  tenderness.  Lymphadenopathy:    She has no cervical adenopathy.  Neurological: She is alert and oriented to person, place, and time. She has normal reflexes.  Skin: Skin is warm.  Psychiatric: She has a normal mood and affect. Her behavior is normal. Judgment and thought content normal.    Assessment/Plan:  Right hydrosalpinx with infertility preop for laparoscopy, lysis of adhesions, removal of right hydrosalpinx, chromotubation, and endometrial biopsy Advanced reproductive age Benefits and risks of laparoscopy and right salpingectomy were discussed with the patient.  Bowel prep instructions were given.  All of patient's questions were answered.  She verbalized understanding.   Patient is also been counseled  about in vitro fertilization postoperatively because of tubal factor infertility and advanced reproductive age.  Governor Specking, MD

## 2016-04-04 NOTE — Anesthesia Preprocedure Evaluation (Addendum)
Anesthesia Evaluation  Patient identified by MRN, date of birth, ID band Patient awake    Reviewed: Allergy & Precautions, NPO status , Patient's Chart, lab work & pertinent test results  Airway Mallampati: II  TM Distance: >3 FB Neck ROM: Full    Dental  (+) Teeth Intact, Dental Advisory Given   Pulmonary Current Smoker,    breath sounds clear to auscultation       Cardiovascular hypertension, Pt. on medications negative cardio ROS   Rhythm:Regular Rate:Normal     Neuro/Psych PSYCHIATRIC DISORDERS Anxiety Depression negative neurological ROS     GI/Hepatic negative GI ROS, Neg liver ROS,   Endo/Other  negative endocrine ROS  Renal/GU negative Renal ROS  negative genitourinary   Musculoskeletal negative musculoskeletal ROS (+)   Abdominal   Peds negative pediatric ROS (+)  Hematology negative hematology ROS (+)   Anesthesia Other Findings   Reproductive/Obstetrics negative OB ROS                            Lab Results  Component Value Date   HGB 12.6 04/04/2016   HCT 37.0 04/04/2016   Lab Results  Component Value Date   NA 140 04/04/2016   K 3.9 04/04/2016   No results found for: INR, PROTIME  EKG: normal EKG, normal sinus rhythm.  Anesthesia Physical Anesthesia Plan  ASA: II  Anesthesia Plan: General   Post-op Pain Management:    Induction: Intravenous  Airway Management Planned: Oral ETT  Additional Equipment:   Intra-op Plan:   Post-operative Plan: Extubation in OR  Informed Consent: I have reviewed the patients History and Physical, chart, labs and discussed the procedure including the risks, benefits and alternatives for the proposed anesthesia with the patient or authorized representative who has indicated his/her understanding and acceptance.   Dental advisory given  Plan Discussed with: CRNA  Anesthesia Plan Comments:         Anesthesia Quick  Evaluation

## 2016-04-04 NOTE — Op Note (Signed)
Operative Note  Preoperative diagnosis: Right hydrosalpinx, infertility  Postoperative diagnosis: Bilateral hydrosalpinx (left greater than right), infertility  Procedure: Laparoscopy, right salpingoneostomy, left salpingectomy, lysis of adhesions, chromotubation, endometrial biopsy  Anesthesia: Gen. endotracheal  Complications: None  Estimated blood loss: 20 cc  Specimens: Endometrial biopsy and left hydrosalpinx to pathology   Findings: On exam under anesthesia, external genitalia and vagina were normal. The cervix looked grossly normal. Uterus sounded to 9 cm. Uterus was multinodular firm, and top of normal size. There were no adnexal masses palpable. There was no posterior fornix nodularity palpable.   On laparoscopy, upper abdomen, liver surface and diaphragm surfaces as well as gallbladder were normal. The appendix was not visualized.. Pelvic peritoneum appeared normal. The uterus was slightly enlarged due to small intramural myomas.  The left tube showed hydrosalpinx and was enlarged to 2.5 cm at its widest portion it was thin-walled. There were minimal filmy adhesions to the left ovary. The left ovary appeared normal except for minimal filmy adhesions. The right tube showed hydrosalpinx with a diameter of 1.5 cm at its widest portion. It was thin-walled. Upon entering the lumen of the right tube there was a 50% preservation of the mucosal rugae.  Description of the procedure: The patient was placed in dorsal supine position and general endotracheal anesthesia was given. 2 g of cefazolin were given intravenously for prophylaxis. Patient was placed in lithotomy position. She was prepped and draped inside manner. A Foley catheter was inserted into the bladder. A ZUMI catheter was placed into the uterine cavity. The surgeon was regloved and a surgical field was created on the abdomen.  After preemptive anesthesia of all surgical sites with 0.5% bupivacaine with 1:200,000 epinephrine, a 5  mm intraumbilical skin incision was made and a Verress needle was inserted. Its correct location was confirmed. A pneumoperitoneum was created with carbon dioxide. A left lower quadrant 5 mm and a right lower quadrant 10 mm incisions were made and ancillary trochars were placed under direct visualization. Later on we also inserted a suprapubic 5 mm trocar under direct visualization. Above findings were noted.  Because the patient's plans for pursuing IVF or donor oocyte IVF were uncertain at the time, we decided to leave the less affected tube after repairing it in situ. We first proceeded with a left salpingectomy since the left tube was more severely affected. Using harmonic Ace we transected the left uterotubal junction. Keeping the left tube under traction successive applications of Harmonic Ace close to the myosalpinx were carried out in an anterograde manner. The tube was extirpated and taken out of the 10 mm trocar. 0.33 units per mL dilute vasopressin solution was injected into the right mesosalpinx in preparation for right salpingoneostomy. A stellate incision was made at the distal tip of the right hydrosalpinx using needle electrodes with 20 W cutting current. Using the distal end of the tube under traction with 2 graspers which were atraumatic and stellate incision was carried into the lumen of the tube using cold scissors. We thus formed four flaps. 3 of the flaps were everted by applying low wattage coagulating current on the serosa which desiccated and kept the flaps in an everted manner. Larger anti-mesosalpingeal flap was sutured with 6-0 Prolene suture onto the serosa of the tube. Chromotubation confirmed right tubal patency. The pelvis was vigorously irrigated and aspirated and hemostasis was checked. The gas was allowed to escape. Instruments were removed. The instrument and lap count were correct. The skin incisions were approximated with Dermabond.  The patient tolerated the procedure  well and was transferred to recovery room in satisfactory condition. Patient will be discharged home on doxycycline 100 mg twice a day.   Governor Specking, MD

## 2016-04-04 NOTE — Discharge Instructions (Signed)

## 2016-04-04 NOTE — Transfer of Care (Signed)
Immediate Anesthesia Transfer of Care Note  Patient: Sandra Hill  Procedure(s) Performed: Procedure(s) (LRB): LAPAROSCOPY OPERATIVE (N/A) LYSIS OF ADHESIONS, RIGHT SALPINGONEOSTOMY, REMOVAL OF LEFT  HYDROSALPINX, ENDOMETRIAL BIOPSY, AND CHROMOTUBATION (Right)  Patient Location: PACU  Anesthesia Type: General  Level of Consciousness: awake, oriented, sedated and patient cooperative  Airway & Oxygen Therapy: Patient Spontanous Breathing and Patient connected to face mask oxygen  Post-op Assessment: Report given to PACU RN and Post -op Vital signs reviewed and stable  Post vital signs: Reviewed and stable  Complications: No apparent anesthesia complications Last Vitals:  Vitals:   04/04/16 1016 04/04/16 1541  BP: (!) 162/88 (!) 142/80  Pulse: 64 92  Resp: 18 18  Temp: 37 C 36.6 C

## 2016-04-05 ENCOUNTER — Encounter (HOSPITAL_BASED_OUTPATIENT_CLINIC_OR_DEPARTMENT_OTHER): Payer: Self-pay | Admitting: Obstetrics and Gynecology

## 2016-04-05 NOTE — Anesthesia Postprocedure Evaluation (Signed)
Anesthesia Post Note  Patient: Sandra Hill  Procedure(s) Performed: Procedure(s) (LRB): LAPAROSCOPY OPERATIVE (N/A) LYSIS OF ADHESIONS, RIGHT SALPINGONEOSTOMY, REMOVAL OF LEFT  HYDROSALPINX, ENDOMETRIAL BIOPSY, AND CHROMOTUBATION (Right)  Patient location during evaluation: PACU Anesthesia Type: General Level of consciousness: awake and alert Pain management: pain level controlled Vital Signs Assessment: post-procedure vital signs reviewed and stable Respiratory status: spontaneous breathing, nonlabored ventilation, respiratory function stable and patient connected to nasal cannula oxygen Cardiovascular status: blood pressure returned to baseline and stable Postop Assessment: no signs of nausea or vomiting Anesthetic complications: no     Last Vitals:  Vitals:   04/04/16 1630 04/04/16 1732  BP: 130/80 (!) 162/89  Pulse: 78 68  Resp: 15 14  Temp:  37.5 C    Last Pain:  Vitals:   04/04/16 1730  TempSrc:   PainSc: 0-No pain   Pain Goal: Patients Stated Pain Goal: 4 (04/04/16 1045)               Effie Berkshire

## 2016-04-18 MED FILL — SHARPS COLLECTOR 1.4QT: 1 days supply | Qty: 1 | Fill #0

## 2016-04-18 MED FILL — SM ALCOHOL 70% PREP PADS: 30 days supply | Qty: 100 | Fill #0

## 2016-04-18 MED FILL — PROGESTERONE OIL 50 MG/ML V: 50 | 20 days supply | Qty: 20 | Fill #0

## 2016-04-18 MED FILL — GONAL-F RFF REDI-JECT 450 U: 450 | 1 days supply | Qty: 1 | Fill #0

## 2016-04-18 MED FILL — METHYLPREDNISOLONE 8 MG TAB: 8 | 4 days supply | Qty: 8 | Fill #0

## 2016-04-18 MED FILL — GONAL-F 1,050 UNITS VIAL: 1050 | 2 days supply | Qty: 2 | Fill #0

## 2016-04-18 MED FILL — BD 3 ML SYRINGE 18GX1-1/2": 18G X 1-1/2 | 30 days supply | Qty: 55 | Fill #0

## 2016-04-18 MED FILL — MENOPUR 75 UNIT VIAL: 75 | 10 days supply | Qty: 20 | Fill #0

## 2016-04-18 MED FILL — BD NEEDLES 22GX1.5": 22G X 1-1/2 | 30 days supply | Qty: 30 | Fill #0

## 2016-04-18 MED FILL — BD NEEDLES 30GX0.5": 30G X 1/2" | 25 days supply | Qty: 25 | Fill #0

## 2016-04-18 MED FILL — VIVELLE-DOT 0.1 MG PATCH: 0.1 | 8 days supply | Qty: 8 | Fill #0

## 2016-04-18 MED FILL — BD NEEDLES 30GX0.5: 30G X 1/2" | 25 days supply | Qty: 25 | Fill #0

## 2016-04-18 MED FILL — BD 3 ML SYRINGE 18GX1-1/2: 18G X 1-1/2 | 30 days supply | Qty: 55 | Fill #0

## 2016-04-18 MED FILL — DOXYCYCLINE HYCLATE 100 MG: 100 | 20 days supply | Qty: 40 | Fill #0

## 2016-04-18 MED FILL — BD NEEDLES 22GX1.5: 22G X 1-1/2 | 30 days supply | Qty: 30 | Fill #0

## 2016-04-18 MED FILL — PREGNYL 10,000 UNITS VIAL: 10000 | 1 days supply | Qty: 1 | Fill #0

## 2016-04-19 MED FILL — traZODone HCL 50 MG TABS: 50 | 90 days supply | Qty: 90 | Fill #0

## 2016-04-25 MED FILL — CETROTIDE 0.25 MG KIT: 0.25 | 7 days supply | Qty: 7 | Fill #0

## 2016-05-08 DIAGNOSIS — Z01419 Encounter for gynecological examination (general) (routine) without abnormal findings: Secondary | ICD-10-CM | POA: Diagnosis not present

## 2016-05-11 DIAGNOSIS — Z113 Encounter for screening for infections with a predominantly sexual mode of transmission: Secondary | ICD-10-CM | POA: Diagnosis not present

## 2016-05-12 DIAGNOSIS — E288 Other ovarian dysfunction: Secondary | ICD-10-CM | POA: Diagnosis not present

## 2016-05-12 DIAGNOSIS — D251 Intramural leiomyoma of uterus: Secondary | ICD-10-CM | POA: Diagnosis not present

## 2016-05-12 DIAGNOSIS — Z319 Encounter for procreative management, unspecified: Secondary | ICD-10-CM | POA: Diagnosis not present

## 2016-05-12 DIAGNOSIS — Z3141 Encounter for fertility testing: Secondary | ICD-10-CM | POA: Diagnosis not present

## 2016-05-12 MED FILL — DOXYCYCLINE HYCLATE 100 MG: 100 | 5 days supply | Qty: 10 | Fill #0

## 2016-05-15 DIAGNOSIS — N858 Other specified noninflammatory disorders of uterus: Secondary | ICD-10-CM | POA: Diagnosis not present

## 2016-05-16 MED FILL — SERTRALINE HCL 100 MG TAB: 100 | 90 days supply | Qty: 135 | Fill #0

## 2016-05-24 DIAGNOSIS — Z319 Encounter for procreative management, unspecified: Secondary | ICD-10-CM | POA: Diagnosis not present

## 2016-05-24 DIAGNOSIS — D251 Intramural leiomyoma of uterus: Secondary | ICD-10-CM | POA: Diagnosis not present

## 2016-05-24 DIAGNOSIS — E288 Other ovarian dysfunction: Secondary | ICD-10-CM | POA: Diagnosis not present

## 2016-05-24 DIAGNOSIS — Z3183 Encounter for assisted reproductive fertility procedure cycle: Secondary | ICD-10-CM | POA: Diagnosis not present

## 2016-05-30 DIAGNOSIS — Z3183 Encounter for assisted reproductive fertility procedure cycle: Secondary | ICD-10-CM | POA: Diagnosis not present

## 2016-05-30 DIAGNOSIS — N979 Female infertility, unspecified: Secondary | ICD-10-CM | POA: Diagnosis not present

## 2016-05-30 DIAGNOSIS — E2839 Other primary ovarian failure: Secondary | ICD-10-CM | POA: Diagnosis not present

## 2016-06-02 DIAGNOSIS — E2839 Other primary ovarian failure: Secondary | ICD-10-CM | POA: Diagnosis not present

## 2016-06-02 DIAGNOSIS — Z3183 Encounter for assisted reproductive fertility procedure cycle: Secondary | ICD-10-CM | POA: Diagnosis not present

## 2016-06-05 DIAGNOSIS — N979 Female infertility, unspecified: Secondary | ICD-10-CM | POA: Diagnosis not present

## 2016-06-05 DIAGNOSIS — Z3183 Encounter for assisted reproductive fertility procedure cycle: Secondary | ICD-10-CM | POA: Diagnosis not present

## 2016-06-05 DIAGNOSIS — E2839 Other primary ovarian failure: Secondary | ICD-10-CM | POA: Diagnosis not present

## 2016-06-05 DIAGNOSIS — N971 Female infertility of tubal origin: Secondary | ICD-10-CM | POA: Diagnosis not present

## 2016-06-08 DIAGNOSIS — E288 Other ovarian dysfunction: Secondary | ICD-10-CM | POA: Diagnosis not present

## 2016-06-08 DIAGNOSIS — Z3183 Encounter for assisted reproductive fertility procedure cycle: Secondary | ICD-10-CM | POA: Diagnosis not present

## 2016-06-09 DIAGNOSIS — E2839 Other primary ovarian failure: Secondary | ICD-10-CM | POA: Diagnosis not present

## 2016-06-09 DIAGNOSIS — N971 Female infertility of tubal origin: Secondary | ICD-10-CM | POA: Diagnosis not present

## 2016-06-09 DIAGNOSIS — N979 Female infertility, unspecified: Secondary | ICD-10-CM | POA: Diagnosis not present

## 2016-06-09 DIAGNOSIS — Z3183 Encounter for assisted reproductive fertility procedure cycle: Secondary | ICD-10-CM | POA: Diagnosis not present

## 2016-06-09 MED FILL — OXYCODONE W/APAP 5/325 TAB: 5-325 | 3 days supply | Qty: 10 | Fill #0

## 2016-06-09 MED FILL — PROMETHAZINE 12.5 MG TABLET: 12.5 | 3 days supply | Qty: 10 | Fill #0

## 2016-06-11 DIAGNOSIS — Z3183 Encounter for assisted reproductive fertility procedure cycle: Secondary | ICD-10-CM | POA: Diagnosis not present

## 2016-07-03 DIAGNOSIS — N979 Female infertility, unspecified: Secondary | ICD-10-CM | POA: Diagnosis not present

## 2016-07-03 DIAGNOSIS — Z3183 Encounter for assisted reproductive fertility procedure cycle: Secondary | ICD-10-CM | POA: Diagnosis not present

## 2016-07-03 DIAGNOSIS — E2839 Other primary ovarian failure: Secondary | ICD-10-CM | POA: Diagnosis not present

## 2016-07-03 DIAGNOSIS — Z319 Encounter for procreative management, unspecified: Secondary | ICD-10-CM | POA: Diagnosis not present

## 2016-07-03 DIAGNOSIS — N971 Female infertility of tubal origin: Secondary | ICD-10-CM | POA: Diagnosis not present

## 2016-07-05 MED FILL — DOXYCYCLINE HYCLATE 100 MG: 100 | 20 days supply | Qty: 40 | Fill #0

## 2016-07-05 MED FILL — BD P/P SYR 21G X 1.5": 21G X 1-1/2 | 30 days supply | Qty: 30 | Fill #0

## 2016-07-05 MED FILL — BD NEEDLES 30GX0.5: 30G X 1/2" | 30 days supply | Qty: 30 | Fill #0

## 2016-07-05 MED FILL — LEUPROLIDE 2WK 1 MG/0.2 ML: 1 | 14 days supply | Qty: 1 | Fill #0

## 2016-07-05 MED FILL — GONAL-F 1,050 UNITS VIAL: 1050 | 9 days supply | Qty: 3 | Fill #0

## 2016-07-05 MED FILL — PREGNYL 10,000 UNITS VIAL: 10000 | 1 days supply | Qty: 1 | Fill #0

## 2016-07-05 MED FILL — SM ALCOHOL 70% PREP PADS: 70 | 30 days supply | Qty: 100 | Fill #0

## 2016-07-05 MED FILL — ULTICARE SYR 0.3 ML 30GX5/1: 30G X 5/16" | 20 days supply | Qty: 20 | Fill #0

## 2016-07-05 MED FILL — BD NEEDLES 30GX0.5": 30G X 1/2" | 30 days supply | Qty: 30 | Fill #0

## 2016-07-05 MED FILL — BD P/P SYR 21G X 1.5: 21G X 1-1/2 | 30 days supply | Qty: 30 | Fill #0

## 2016-07-05 MED FILL — MENOPUR 75 UNIT VIAL: 75 | 20 days supply | Qty: 20 | Fill #0

## 2016-07-21 DIAGNOSIS — N979 Female infertility, unspecified: Secondary | ICD-10-CM | POA: Diagnosis not present

## 2016-07-21 DIAGNOSIS — N971 Female infertility of tubal origin: Secondary | ICD-10-CM | POA: Diagnosis not present

## 2016-07-21 DIAGNOSIS — E2839 Other primary ovarian failure: Secondary | ICD-10-CM | POA: Diagnosis not present

## 2016-07-21 DIAGNOSIS — Z3183 Encounter for assisted reproductive fertility procedure cycle: Secondary | ICD-10-CM | POA: Diagnosis not present

## 2016-07-25 DIAGNOSIS — N971 Female infertility of tubal origin: Secondary | ICD-10-CM | POA: Diagnosis not present

## 2016-07-25 DIAGNOSIS — N979 Female infertility, unspecified: Secondary | ICD-10-CM | POA: Diagnosis not present

## 2016-07-25 DIAGNOSIS — E2839 Other primary ovarian failure: Secondary | ICD-10-CM | POA: Diagnosis not present

## 2016-07-25 DIAGNOSIS — Z3183 Encounter for assisted reproductive fertility procedure cycle: Secondary | ICD-10-CM | POA: Diagnosis not present

## 2016-07-28 DIAGNOSIS — E2839 Other primary ovarian failure: Secondary | ICD-10-CM | POA: Diagnosis not present

## 2016-07-28 DIAGNOSIS — N979 Female infertility, unspecified: Secondary | ICD-10-CM | POA: Diagnosis not present

## 2016-07-28 DIAGNOSIS — Z3183 Encounter for assisted reproductive fertility procedure cycle: Secondary | ICD-10-CM | POA: Diagnosis not present

## 2016-07-28 DIAGNOSIS — N971 Female infertility of tubal origin: Secondary | ICD-10-CM | POA: Diagnosis not present

## 2016-07-31 DIAGNOSIS — E2839 Other primary ovarian failure: Secondary | ICD-10-CM | POA: Diagnosis not present

## 2016-07-31 DIAGNOSIS — Z3183 Encounter for assisted reproductive fertility procedure cycle: Secondary | ICD-10-CM | POA: Diagnosis not present

## 2016-07-31 DIAGNOSIS — N971 Female infertility of tubal origin: Secondary | ICD-10-CM | POA: Diagnosis not present

## 2016-07-31 DIAGNOSIS — N979 Female infertility, unspecified: Secondary | ICD-10-CM | POA: Diagnosis not present

## 2016-08-01 DIAGNOSIS — E2839 Other primary ovarian failure: Secondary | ICD-10-CM | POA: Diagnosis not present

## 2016-08-03 DIAGNOSIS — Z3141 Encounter for fertility testing: Secondary | ICD-10-CM | POA: Diagnosis not present

## 2016-08-03 DIAGNOSIS — Z3183 Encounter for assisted reproductive fertility procedure cycle: Secondary | ICD-10-CM | POA: Diagnosis not present

## 2016-08-03 DIAGNOSIS — N8500 Endometrial hyperplasia, unspecified: Secondary | ICD-10-CM | POA: Diagnosis not present

## 2016-08-08 DIAGNOSIS — Z3183 Encounter for assisted reproductive fertility procedure cycle: Secondary | ICD-10-CM | POA: Diagnosis not present

## 2016-08-16 DIAGNOSIS — I10 Essential (primary) hypertension: Secondary | ICD-10-CM | POA: Diagnosis not present

## 2016-08-16 DIAGNOSIS — G4733 Obstructive sleep apnea (adult) (pediatric): Secondary | ICD-10-CM | POA: Diagnosis not present

## 2016-08-16 DIAGNOSIS — F322 Major depressive disorder, single episode, severe without psychotic features: Secondary | ICD-10-CM | POA: Diagnosis not present

## 2016-08-16 MED FILL — LABETALOL HCL 100 MG TABLET: 100 | 30 days supply | Qty: 30 | Fill #0

## 2016-08-18 MED FILL — SERTRALINE HCL 100 MG TAB: 100 | 90 days supply | Qty: 135 | Fill #0

## 2016-08-18 MED FILL — traZODone HCL 50 MG TABS: 50 | 90 days supply | Qty: 90 | Fill #0

## 2016-08-23 DIAGNOSIS — Z3183 Encounter for assisted reproductive fertility procedure cycle: Secondary | ICD-10-CM | POA: Diagnosis not present

## 2016-08-25 MED FILL — DOXYCYCLINE HYCLATE 100 MG: 100 | 20 days supply | Qty: 40 | Fill #0

## 2016-08-31 MED FILL — BD NEEDLES 30GX0.5": 30G X 1/2" | 30 days supply | Qty: 25 | Fill #0

## 2016-08-31 MED FILL — BD 3 ML SYRINGE 18GX1-1/2": 18G X 1-1/2 | 30 days supply | Qty: 25 | Fill #0

## 2016-08-31 MED FILL — BD NEEDLES 30GX0.5: 30G X 1/2" | 30 days supply | Qty: 25 | Fill #0

## 2016-08-31 MED FILL — PREGNYL 10,000 UNITS VIAL: 10000 | 30 days supply | Qty: 1 | Fill #0

## 2016-08-31 MED FILL — ESTRADIOL 0.1 MG PATCH: 0.1 | 28 days supply | Qty: 8 | Fill #0

## 2016-08-31 MED FILL — BD 3 ML SYRINGE 18GX1-1/2: 18G X 1-1/2 | 30 days supply | Qty: 25 | Fill #0

## 2016-08-31 MED FILL — MENOPUR 75 UNIT VIAL: 75 | 30 days supply | Qty: 20 | Fill #0

## 2016-09-05 MED FILL — GONAL-F 1,050 UNITS VIAL: 1050 | 30 days supply | Qty: 3 | Fill #0

## 2016-09-15 DIAGNOSIS — N979 Female infertility, unspecified: Secondary | ICD-10-CM | POA: Diagnosis not present

## 2016-09-15 DIAGNOSIS — N971 Female infertility of tubal origin: Secondary | ICD-10-CM | POA: Diagnosis not present

## 2016-09-15 DIAGNOSIS — E288 Other ovarian dysfunction: Secondary | ICD-10-CM | POA: Diagnosis not present

## 2016-09-15 DIAGNOSIS — E2839 Other primary ovarian failure: Secondary | ICD-10-CM | POA: Diagnosis not present

## 2016-09-15 DIAGNOSIS — Z3183 Encounter for assisted reproductive fertility procedure cycle: Secondary | ICD-10-CM | POA: Diagnosis not present

## 2016-09-19 DIAGNOSIS — E288 Other ovarian dysfunction: Secondary | ICD-10-CM | POA: Diagnosis not present

## 2016-09-19 DIAGNOSIS — E2839 Other primary ovarian failure: Secondary | ICD-10-CM | POA: Diagnosis not present

## 2016-09-19 DIAGNOSIS — N979 Female infertility, unspecified: Secondary | ICD-10-CM | POA: Diagnosis not present

## 2016-09-19 DIAGNOSIS — Z3183 Encounter for assisted reproductive fertility procedure cycle: Secondary | ICD-10-CM | POA: Diagnosis not present

## 2016-09-19 DIAGNOSIS — N971 Female infertility of tubal origin: Secondary | ICD-10-CM | POA: Diagnosis not present

## 2016-09-21 DIAGNOSIS — Z3183 Encounter for assisted reproductive fertility procedure cycle: Secondary | ICD-10-CM | POA: Diagnosis not present

## 2016-09-21 DIAGNOSIS — E2839 Other primary ovarian failure: Secondary | ICD-10-CM | POA: Diagnosis not present

## 2016-09-21 DIAGNOSIS — N971 Female infertility of tubal origin: Secondary | ICD-10-CM | POA: Diagnosis not present

## 2016-09-21 DIAGNOSIS — N979 Female infertility, unspecified: Secondary | ICD-10-CM | POA: Diagnosis not present

## 2016-09-25 DIAGNOSIS — G4719 Other hypersomnia: Secondary | ICD-10-CM | POA: Diagnosis not present

## 2016-09-25 DIAGNOSIS — E2839 Other primary ovarian failure: Secondary | ICD-10-CM | POA: Diagnosis not present

## 2016-09-25 DIAGNOSIS — Z3189 Encounter for other procreative management: Secondary | ICD-10-CM | POA: Diagnosis not present

## 2016-09-25 DIAGNOSIS — N971 Female infertility of tubal origin: Secondary | ICD-10-CM | POA: Diagnosis not present

## 2016-09-25 DIAGNOSIS — N979 Female infertility, unspecified: Secondary | ICD-10-CM | POA: Diagnosis not present

## 2016-09-25 DIAGNOSIS — Z3183 Encounter for assisted reproductive fertility procedure cycle: Secondary | ICD-10-CM | POA: Diagnosis not present

## 2016-09-25 DIAGNOSIS — R0681 Apnea, not elsewhere classified: Secondary | ICD-10-CM | POA: Diagnosis not present

## 2016-09-25 DIAGNOSIS — I10 Essential (primary) hypertension: Secondary | ICD-10-CM | POA: Diagnosis not present

## 2016-09-25 DIAGNOSIS — G2581 Restless legs syndrome: Secondary | ICD-10-CM | POA: Diagnosis not present

## 2016-09-25 MED FILL — MENOPUR 75 UNIT VIAL: 75 | 7 days supply | Qty: 5 | Fill #1

## 2016-09-27 DIAGNOSIS — Z3183 Encounter for assisted reproductive fertility procedure cycle: Secondary | ICD-10-CM | POA: Diagnosis not present

## 2016-09-27 DIAGNOSIS — N971 Female infertility of tubal origin: Secondary | ICD-10-CM | POA: Diagnosis not present

## 2016-09-27 DIAGNOSIS — E2839 Other primary ovarian failure: Secondary | ICD-10-CM | POA: Diagnosis not present

## 2016-09-27 DIAGNOSIS — N979 Female infertility, unspecified: Secondary | ICD-10-CM | POA: Diagnosis not present

## 2016-09-28 DIAGNOSIS — G4733 Obstructive sleep apnea (adult) (pediatric): Secondary | ICD-10-CM | POA: Diagnosis not present

## 2016-09-29 DIAGNOSIS — N979 Female infertility, unspecified: Secondary | ICD-10-CM | POA: Diagnosis not present

## 2016-09-29 DIAGNOSIS — E2839 Other primary ovarian failure: Secondary | ICD-10-CM | POA: Diagnosis not present

## 2016-09-29 DIAGNOSIS — E288 Other ovarian dysfunction: Secondary | ICD-10-CM | POA: Diagnosis not present

## 2016-09-29 DIAGNOSIS — N971 Female infertility of tubal origin: Secondary | ICD-10-CM | POA: Diagnosis not present

## 2016-09-29 DIAGNOSIS — Z3183 Encounter for assisted reproductive fertility procedure cycle: Secondary | ICD-10-CM | POA: Diagnosis not present

## 2016-09-30 DIAGNOSIS — Z319 Encounter for procreative management, unspecified: Secondary | ICD-10-CM | POA: Diagnosis not present

## 2016-10-02 DIAGNOSIS — Z3183 Encounter for assisted reproductive fertility procedure cycle: Secondary | ICD-10-CM | POA: Diagnosis not present

## 2016-10-07 DIAGNOSIS — Z3183 Encounter for assisted reproductive fertility procedure cycle: Secondary | ICD-10-CM | POA: Diagnosis not present

## 2016-10-12 DIAGNOSIS — G4733 Obstructive sleep apnea (adult) (pediatric): Secondary | ICD-10-CM | POA: Diagnosis not present

## 2016-10-20 DIAGNOSIS — Z319 Encounter for procreative management, unspecified: Secondary | ICD-10-CM | POA: Diagnosis not present

## 2016-10-30 MED FILL — SM ALCOHOL 70% PREP PADS: 30 days supply | Qty: 100 | Fill #0

## 2016-10-30 MED FILL — GONAL-F 1,050 UNITS VIAL: 1050 | 3 days supply | Qty: 3 | Fill #0

## 2016-10-30 MED FILL — DOXYCYCLINE HYCLATE 100 MG: 100 | 20 days supply | Qty: 40 | Fill #0

## 2016-10-30 MED FILL — PREGNYL 10,000 UNITS VIAL: 10000 | 1 days supply | Qty: 1 | Fill #0

## 2016-10-30 MED FILL — SHARPS COLLECTOR 1.4QT: 30 days supply | Qty: 1 | Fill #0

## 2016-10-30 MED FILL — MENOPUR 75 UNIT VIAL: 75 | 10 days supply | Qty: 20 | Fill #0

## 2016-11-11 DIAGNOSIS — G4733 Obstructive sleep apnea (adult) (pediatric): Secondary | ICD-10-CM | POA: Diagnosis not present

## 2016-12-12 DIAGNOSIS — G4733 Obstructive sleep apnea (adult) (pediatric): Secondary | ICD-10-CM | POA: Diagnosis not present

## 2016-12-28 MED FILL — SERTRALINE HCL 100 MG TAB: 100 | 90 days supply | Qty: 135 | Fill #0

## 2017-01-12 DIAGNOSIS — G4733 Obstructive sleep apnea (adult) (pediatric): Secondary | ICD-10-CM | POA: Diagnosis not present

## 2017-03-13 DIAGNOSIS — I10 Essential (primary) hypertension: Secondary | ICD-10-CM | POA: Diagnosis not present

## 2017-03-13 DIAGNOSIS — G250 Essential tremor: Secondary | ICD-10-CM | POA: Diagnosis not present

## 2017-03-27 MED FILL — SERTRALINE HCL 100 MG TAB: 100 | 90 days supply | Qty: 135 | Fill #1

## 2017-03-28 MED FILL — LABETALOL HCL 100 MG TABLET: 100 | 30 days supply | Qty: 30 | Fill #0

## 2017-03-28 MED FILL — traZODone HCL 50 MG TABS: 50 | 90 days supply | Qty: 90 | Fill #0

## 2017-04-05 DIAGNOSIS — K5900 Constipation, unspecified: Secondary | ICD-10-CM | POA: Diagnosis not present

## 2017-04-05 DIAGNOSIS — R5382 Chronic fatigue, unspecified: Secondary | ICD-10-CM | POA: Diagnosis not present

## 2017-04-05 DIAGNOSIS — R198 Other specified symptoms and signs involving the digestive system and abdomen: Secondary | ICD-10-CM | POA: Diagnosis not present

## 2017-04-05 DIAGNOSIS — R143 Flatulence: Secondary | ICD-10-CM | POA: Diagnosis not present

## 2017-04-05 DIAGNOSIS — K580 Irritable bowel syndrome with diarrhea: Secondary | ICD-10-CM | POA: Diagnosis not present

## 2017-04-05 DIAGNOSIS — R197 Diarrhea, unspecified: Secondary | ICD-10-CM | POA: Diagnosis not present

## 2017-04-05 DIAGNOSIS — R110 Nausea: Secondary | ICD-10-CM | POA: Diagnosis not present

## 2017-04-05 DIAGNOSIS — R195 Other fecal abnormalities: Secondary | ICD-10-CM | POA: Diagnosis not present

## 2017-04-22 DIAGNOSIS — R195 Other fecal abnormalities: Secondary | ICD-10-CM | POA: Diagnosis not present

## 2017-04-22 DIAGNOSIS — R110 Nausea: Secondary | ICD-10-CM | POA: Diagnosis not present

## 2017-04-22 DIAGNOSIS — K580 Irritable bowel syndrome with diarrhea: Secondary | ICD-10-CM | POA: Diagnosis not present

## 2017-04-22 DIAGNOSIS — R143 Flatulence: Secondary | ICD-10-CM | POA: Diagnosis not present

## 2017-04-22 DIAGNOSIS — K581 Irritable bowel syndrome with constipation: Secondary | ICD-10-CM | POA: Diagnosis not present

## 2017-04-22 DIAGNOSIS — R197 Diarrhea, unspecified: Secondary | ICD-10-CM | POA: Diagnosis not present

## 2017-04-22 DIAGNOSIS — K5900 Constipation, unspecified: Secondary | ICD-10-CM | POA: Diagnosis not present

## 2017-04-22 DIAGNOSIS — R198 Other specified symptoms and signs involving the digestive system and abdomen: Secondary | ICD-10-CM | POA: Diagnosis not present

## 2017-08-27 MED FILL — LABETALOL HCL 100 MG TABLET: 100 | 30 days supply | Qty: 30 | Fill #0

## 2017-08-27 MED FILL — traZODone HCL 50 MG TABS: 50 | 30 days supply | Qty: 30 | Fill #0

## 2017-08-27 MED FILL — SERTRALINE HCL 100 MG TAB: 100 | 30 days supply | Qty: 45 | Fill #0

## 2017-09-11 MED FILL — LISINOPRIL-HCTZ 10-12.5 MG: 10-12.5 | 30 days supply | Qty: 30 | Fill #0

## 2017-10-21 MED FILL — LISINOPRIL-HCTZ 10-12.5 MG: 10-12.5 | 30 days supply | Qty: 30 | Fill #1

## 2017-10-22 MED FILL — traZODone HCL 50 MG TABS: 50 | 90 days supply | Qty: 90 | Fill #0

## 2017-10-22 MED FILL — SERTRALINE HCL 100 MG TAB: 100 | 90 days supply | Qty: 135 | Fill #0

## 2018-03-08 MED FILL — PENICILLIN VK 500 MG TABLET: 500 | 7 days supply | Qty: 28 | Fill #0

## 2018-03-08 MED FILL — HYDROCODON-APAP 5-325: 5-325 | 3 days supply | Qty: 18 | Fill #0

## 2018-03-19 ENCOUNTER — Telehealth: Payer: No Typology Code available for payment source | Admitting: Family Medicine

## 2018-03-19 DIAGNOSIS — J329 Chronic sinusitis, unspecified: Secondary | ICD-10-CM

## 2018-03-19 MED ORDER — AMOXICILLIN-POT CLAVULANATE 875-125 MG PO TABS: 1.0000 | ORAL_TABLET | Freq: Two times a day (BID) | ORAL | 0 refills | Status: AC

## 2018-03-19 MED FILL — AMOX-CLAV 875-125 MG TABLET: 875-125 | 7 days supply | Qty: 14 | Fill #0

## 2018-03-19 NOTE — Progress Notes (Signed)
We are sorry that you are not feeling well.  Here is how we plan to help!  Based on what you have shared with me it looks like you have sinusitis.  Sinusitis is inflammation and infection in the sinus cavities of the head.  Based on your presentation I believe you most likely have Acute Bacterial Sinusitis.  This is an infection caused by bacteria and is treated with antibiotics. I have prescribed Augmentin 875mg /125mg  one tablet twice daily with food, for 7 days. You may use an oral decongestant such as Mucinex D or if you have glaucoma or high blood pressure use plain Mucinex. Saline nasal spray help and can safely be used as often as needed for congestion.  If you develop worsening sinus pain, fever or notice severe headache and vision changes, or if symptoms are not better after completion of antibiotic, please schedule an appointment with a health care provider or your dentist who performed the procedure.  Sinus infections are not as easily transmitted as other respiratory infection, however we still recommend that you avoid close contact with loved ones, especially the very young and elderly.  Remember to wash your hands thoroughly throughout the day as this is the number one way to prevent the spread of infection!  Home Care:  Only take medications as instructed by your medical team.  Complete the entire course of an antibiotic.  Do not take these medications with alcohol.  A steam or ultrasonic humidifier can help congestion.  You can place a towel over your head and breathe in the steam from hot water coming from a faucet.  Avoid close contacts especially the very young and the elderly.  Cover your mouth when you cough or sneeze.  Always remember to wash your hands.  Get Help Right Away If:  You develop worsening fever or sinus pain.  You develop a severe head ache or visual changes.  Your symptoms persist after you have completed your treatment plan.  Make sure  you  Understand these instructions.  Will watch your condition.  Will get help right away if you are not doing well or get worse.  Your e-visit answers were reviewed by a board certified advanced clinical practitioner to complete your personal care plan.  Depending on the condition, your plan could have included both over the counter or prescription medications.  If there is a problem please reply  once you have received a response from your provider.  Your safety is important to [place].  If you have drug allergies check your prescription carefully.    You can use MyChart to ask questions about today's visit, request a non-urgent call back, or ask for a work or school excuse for 24 hours related to this e-Visit. If it has been greater than 24 hours you will need to follow up with your provider, or enter a new e-Visit to address those concerns.  You will get an e-mail in the next two days asking about your experience.  I hope that your e-visit has been valuable and will speed your recovery. Thank you for using e-visits.  Based on what you shared with me it looks like you have a serious condition that should be evaluated in a face to face office visit.  NOTE: If you entered your credit card information for this eVisit, you will not be charged. You may see a "hold" on your card for the $30 but that hold will drop off and you will not have a charge processed.  If you are having a true medical emergency please call 911.  If you need an urgent face to face visit,  has four urgent care centers for your convenience.  If you need care fast and have a high deductible or no insurance consider:   [email or web address] to reserve your spot online an avoid wait times  [facility] [address], [address] [place], [place] [address] 8 am to 8 pm Monday-Friday 10 am to 4 pm Saturday-Sunday *Across the street from [employer]  [place], [address] 8 am to 5 pm Monday-Friday * In the [facility] on the [facility]   The following sites will take your  insurance:  . [facility] [place] a Provider at this Location  [address] [place], [place] [address] . 10 am to 8 pm Monday-Friday . 12 pm to 8 pm Saturday-Sunday   . [facility] [place] a Provider at this Location  [address], [place], [place] [address] . 8 am to 8 pm Monday-Friday . 9 am to 6 pm Saturday . 11 am to 6 pm Sunday   . [facility] at [place] Get Driving Directions  [address] [place], [place] [address] . 8 am to 8 pm Monday-Friday . 8 am to 4 pm Saturday-Sunday   Your e-visit answers were reviewed by a board certified advanced clinical practitioner to complete your personal care plan.  Thank you for using e-Visits.

## 2018-03-31 IMAGING — DX DG SHOULDER LEFT
2 series · 2 of 2 positions shown · non-contrast
Comparison: None.

CLINICAL DATA: Slipped and fell on hardwood floor, with injury to
left shoulder. Left anterior shoulder pain. Initial encounter.

EXAM:
LEFT SHOULDER - 2+ VIEW

[shoulder grashey]
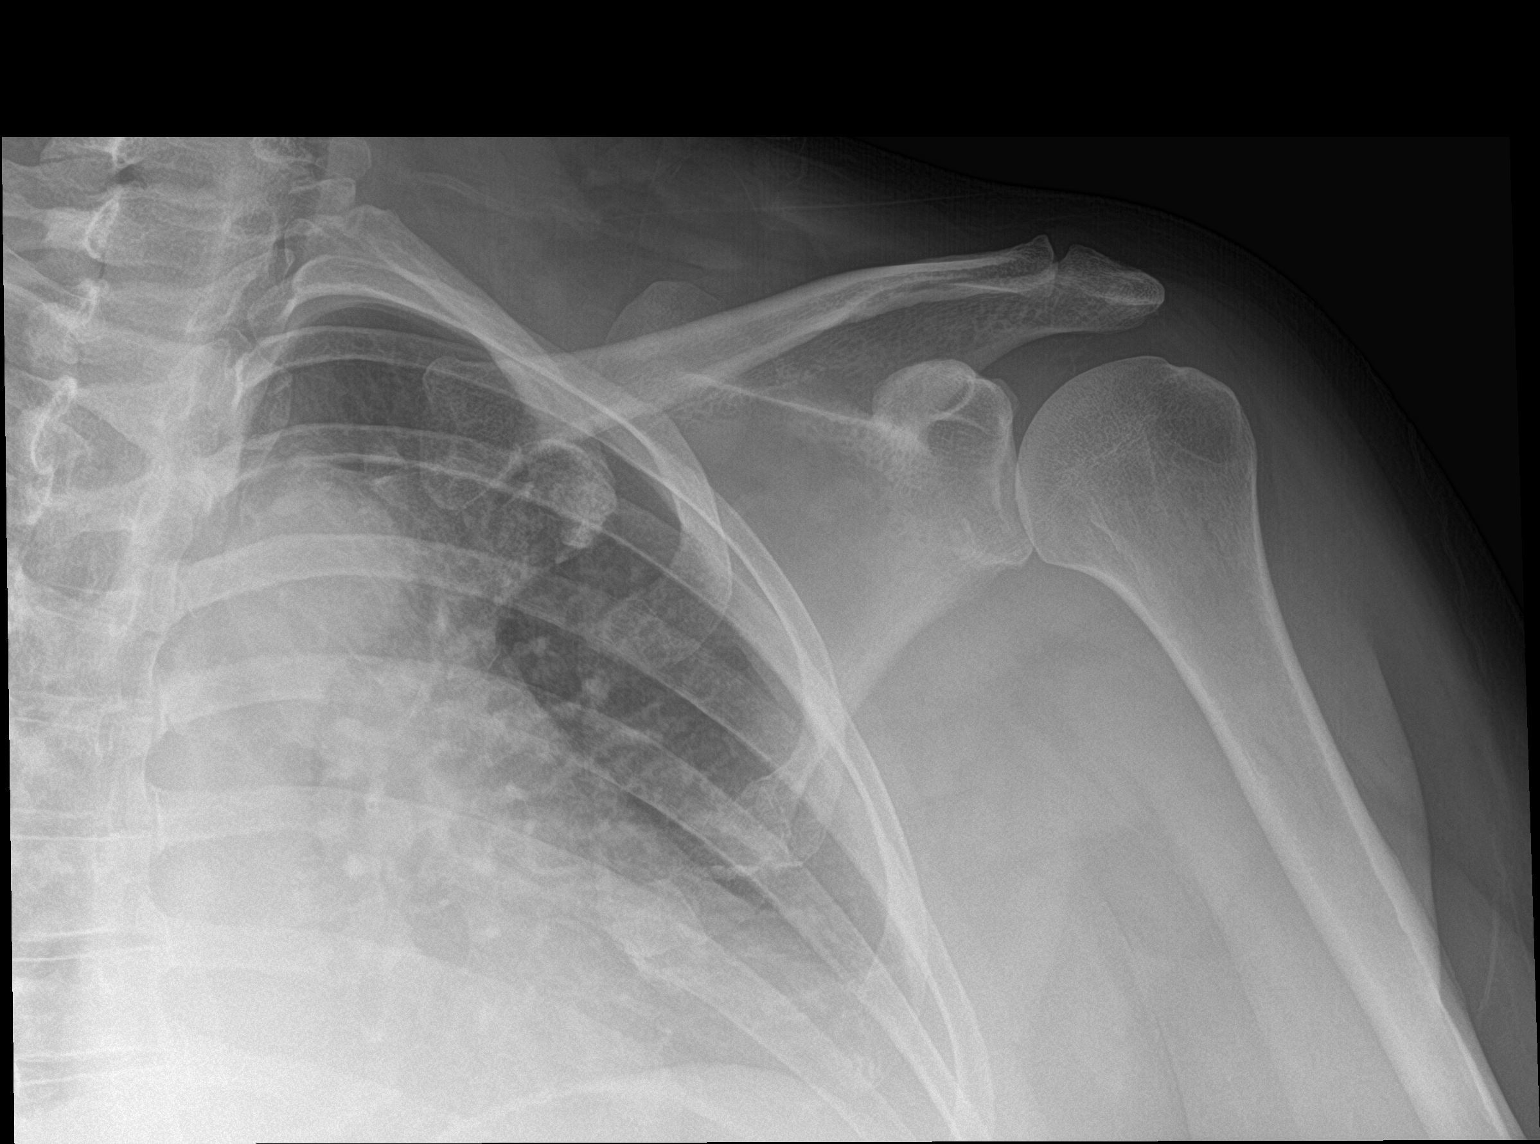

[shoulder y view]
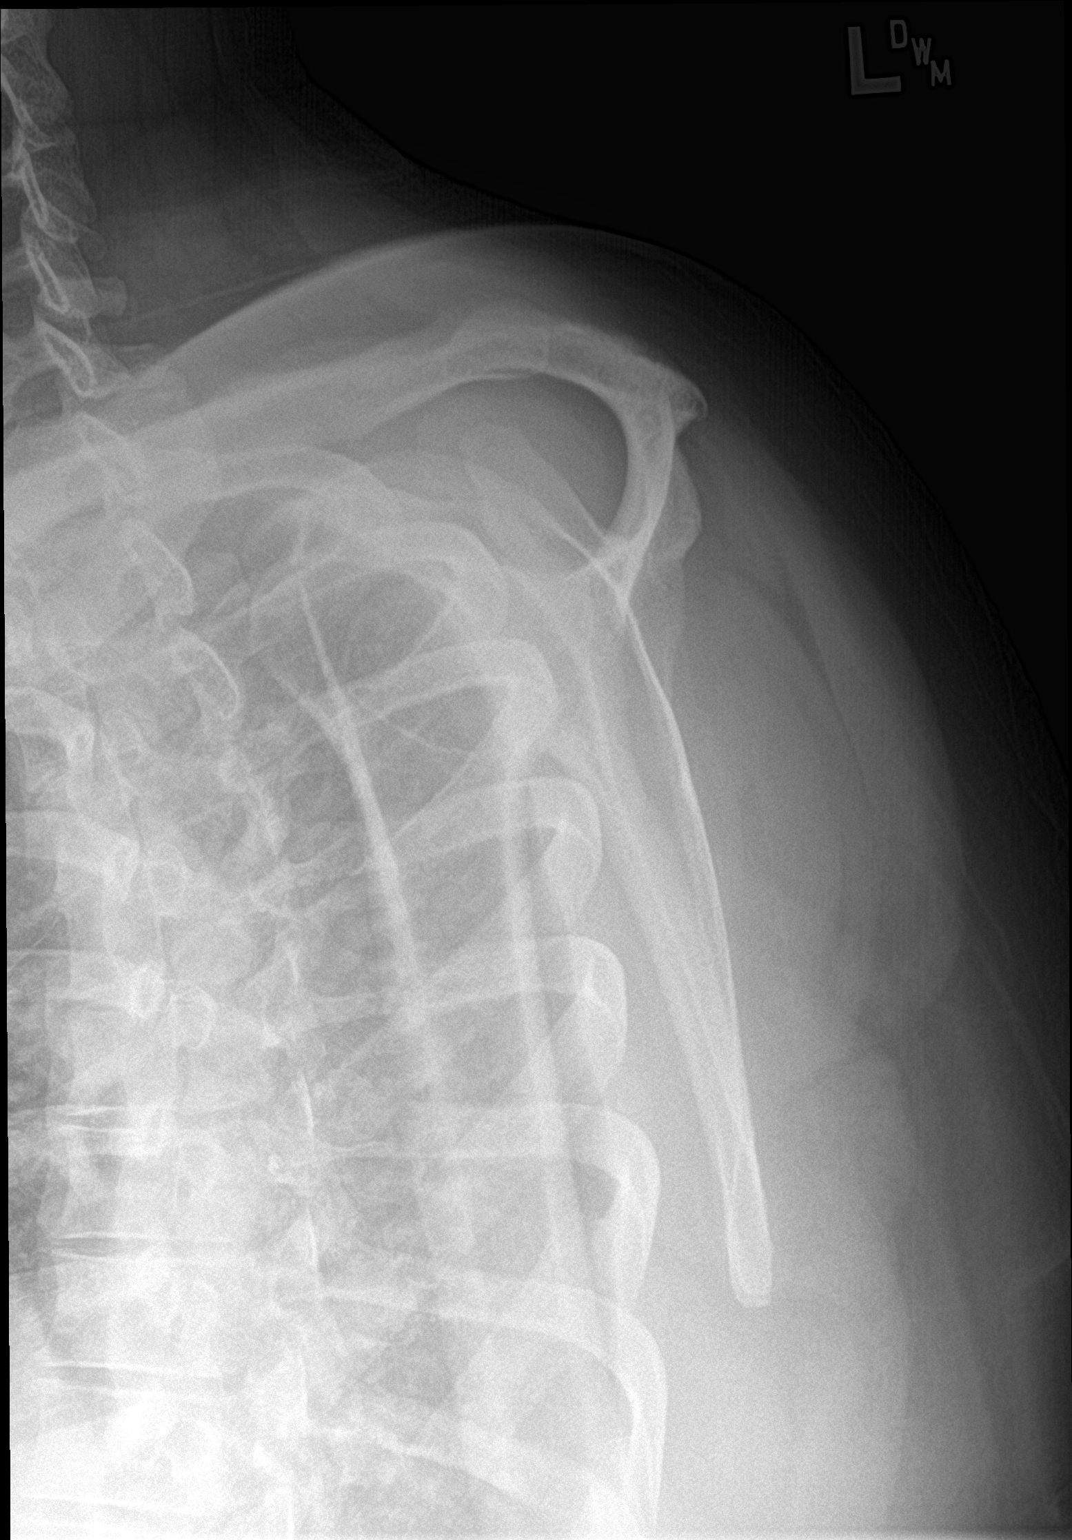

[2 of 2 positions shown; findings below may reference images not displayed]

FINDINGS: There is question of anterior subluxation or mild dislocation of the
left humeral head.

There is no evidence of fracture. The acromioclavicular joint is
unremarkable in appearance. No significant soft tissue abnormalities
are seen. The visualized portions of the left lung are clear.
IMPRESSION: Question of anterior subluxation or mild dislocation of the left
humeral head. No evidence of fracture. Would correlate with the
patient's symptoms.

## 2018-04-22 MED FILL — LISINOPRIL-HCTZ 10-12.5 MG: 10-12.5 | 30 days supply | Qty: 30 | Fill #0

## 2018-04-22 MED FILL — traZODone HCL 50 MG TABS: 50 | 90 days supply | Qty: 90 | Fill #1

## 2018-05-09 ENCOUNTER — Other Ambulatory Visit: Payer: Self-pay | Admitting: Family Medicine

## 2018-05-09 DIAGNOSIS — Z1231 Encounter for screening mammogram for malignant neoplasm of breast: Secondary | ICD-10-CM

## 2018-05-10 ENCOUNTER — Ambulatory Visit
Admission: RE | Admit: 2018-05-10 | Discharge: 2018-05-10 | Disposition: A | Discharge status: Home / Self Care | Payer: No Typology Code available for payment source | Source: Ambulatory Visit

## 2018-05-10 DIAGNOSIS — Z1231 Encounter for screening mammogram for malignant neoplasm of breast: Secondary | ICD-10-CM

## 2018-06-12 MED FILL — SERTRALINE HCL 100 MG TAB: 100 | 90 days supply | Qty: 135 | Fill #0

## 2018-06-12 MED FILL — LISINOPRIL-HCTZ 10-12.5 MG: 10-12.5 | 90 days supply | Qty: 90 | Fill #0

## 2018-06-17 MED FILL — VIT D2 1.25 MG (50,000 UNIT: 1.25 MG | 84 days supply | Qty: 12 | Fill #0

## 2018-06-21 ENCOUNTER — Ambulatory Visit: Payer: No Typology Code available for payment source | Admitting: Podiatry

## 2018-06-21 VITALS — BP 161/89 | HR 73

## 2018-06-21 DIAGNOSIS — L600 Ingrowing nail: Secondary | ICD-10-CM | POA: Diagnosis not present

## 2018-06-21 DIAGNOSIS — B351 Tinea unguium: Secondary | ICD-10-CM

## 2018-06-21 DIAGNOSIS — Z79899 Other long term (current) drug therapy: Secondary | ICD-10-CM

## 2018-06-22 DIAGNOSIS — B351 Tinea unguium: Secondary | ICD-10-CM | POA: Insufficient documentation

## 2018-06-22 DIAGNOSIS — L600 Ingrowing nail: Secondary | ICD-10-CM | POA: Insufficient documentation

## 2018-06-22 LAB — CBC WITH DIFFERENTIAL/PLATELET
Absolute Monocytes: 576 cells/uL (ref 200–950)
Basophils Absolute: 40 cells/uL (ref 0–200)
Basophils Relative: 0.6 %
Eosinophils Absolute: 201 cells/uL (ref 15–500)
Eosinophils Relative: 3 %
HCT: 40.2 % (ref 35.0–45.0)
Hemoglobin: 14 g/dL (ref 11.7–15.5)
Lymphs Abs: 1782 cells/uL (ref 850–3900)
MCH: 31.9 pg (ref 27.0–33.0)
MCHC: 34.8 g/dL (ref 32.0–36.0)
MCV: 91.6 fL (ref 80.0–100.0)
MPV: 12.3 fL (ref 7.5–12.5)
Monocytes Relative: 8.6 %
NEUTROS ABS: 4100 {cells}/uL (ref 1500–7800)
Neutrophils Relative %: 61.2 %
Platelets: 210 10*3/uL (ref 140–400)
RBC: 4.39 10*6/uL (ref 3.80–5.10)
RDW: 13.1 % (ref 11.0–15.0)
Total Lymphocyte: 26.6 %
WBC: 6.7 10*3/uL (ref 3.8–10.8)

## 2018-06-22 LAB — HEPATIC FUNCTION PANEL
AG Ratio: 1.6 (calc) (ref 1.0–2.5)
ALT: 28 U/L (ref 6–29)
AST: 20 U/L (ref 10–35)
Albumin: 4.2 g/dL (ref 3.6–5.1)
Alkaline phosphatase (APISO): 111 U/L (ref 31–125)
BILIRUBIN INDIRECT: 0.2 mg/dL (ref 0.2–1.2)
Bilirubin, Direct: 0.1 mg/dL (ref 0.0–0.2)
Globulin: 2.7 g/dL (calc) (ref 1.9–3.7)
Total Bilirubin: 0.3 mg/dL (ref 0.2–1.2)
Total Protein: 6.9 g/dL (ref 6.1–8.1)

## 2018-06-22 NOTE — Progress Notes (Signed)
Subjective:   Patient ID: Sandra Hill, female   DOB: 48 y.o.   MRN: [id number]   HPI 48 year old female presents the office today for concerns of ingrown toenails to both of her big toes, medial and lateral aspects as well as her toenail fungus.  The nails have become thick and discolored.  Also she has noticed both of her big toe is starting to curve into the corners causing discomfort but she denies any recent injury or trauma.  She states that her toenails look strange.  She has no other concerns.   Review of Systems  All other systems reviewed and are negative.  Past Medical History:  Diagnosis Date  . Anxiety   . Borderline hypertension   . Depression   . Dysuria   . Salpingitis or oophoritis    chronic  . UTI (urinary tract infection)    dx 03-31-2016    Past Surgical History:  Procedure Laterality Date  . LAPAROSCOPY N/A 04/04/2016   Procedure: LAPAROSCOPY OPERATIVE;  Surgeon: Governor Specking, MD;  Location: [facility];  Service: Gynecology;  Laterality: N/A;  . LYSIS OF ADHESION Right 04/04/2016   Procedure: LYSIS OF ADHESIONS, RIGHT SALPINGONEOSTOMY, REMOVAL OF LEFT  HYDROSALPINX, ENDOMETRIAL BIOPSY, AND CHROMOTUBATION;  Surgeon: Governor Specking, MD;  Location: [facility];  Service: Gynecology;  Laterality: Right;  . NO PAST SURGERIES       Current Outpatient Medications:  .  doxycycline (VIBRAMYCIN) 50 MG capsule, Take 2 capsules (100 mg total) by mouth 2 (two) times daily., Disp: 14 capsule, Rfl: 0 .  hydrochlorothiazide (MICROZIDE) 12.5 MG capsule, Take 12.5 mg by mouth as directed. , Disp: , Rfl:  .  HYDROcodone-acetaminophen (NORCO/VICODIN) 5-325 MG tablet, TAKE 1 TABLET BY MOUTH EVERY 4 TO 6 HOURS AS NEEDED PAIN, Disp: , Rfl:  .  lisinopril-hydrochlorothiazide (PRINZIDE,ZESTORETIC) 10-12.5 MG tablet, , Disp: , Rfl:  .  Multiple Vitamin (MULTIVITAMIN) tablet, Take 1 tablet by mouth every evening., Disp: , Rfl:  .   nitrofurantoin, macrocrystal-monohydrate, (MACROBID) 100 MG capsule, Take 1 capsule (100 mg total) by mouth 2 (two) times daily., Disp: 10 capsule, Rfl: 0 .  ondansetron (ZOFRAN) 4 MG tablet, Take 1 tablet (4 mg total) by mouth every 8 (eight) hours as needed for nausea or vomiting., Disp: 20 tablet, Rfl: 0 .  oxyCODONE-acetaminophen (PERCOCET) 7.5-325 MG tablet, Take 1 tablet by mouth every 4 (four) hours as needed., Disp: 20 tablet, Rfl: 0 .  penicillin v potassium (VEETID) 500 MG tablet, , Disp: , Rfl: 0 .  sertraline (ZOLOFT) 100 MG tablet, Take 100 mg by mouth every morning., Disp: , Rfl:  .  traZODone (DESYREL) 100 MG tablet, Take 100 mg by mouth at bedtime., Disp: , Rfl:  .  Vitamin D, Ergocalciferol, (DRISDOL) 1.25 MG (50000 UT) CAPS capsule, , Disp: , Rfl:   No Known Allergies       Objective:  Physical Exam  General: AAO x3, NAD  Dermatological: There is incurvation of both the medial lateral aspects of bilateral hallux toenails without any edema, erythema or any other signs of infection.  Overall the nails are hypertrophic, dystrophic with yellow and brown discoloration.  No pain to the lesser digit toenails.  No open lesions.  Vascular: Dorsalis Pedis artery and Posterior Tibial artery pedal pulses are 2/4 bilateral with immedate capillary fill time. There is no pain with calf compression, swelling, warmth, erythema.   Neruologic: Grossly intact via light touch bilateral. Vibratory intact via tuning fork  bilateral. Protective threshold with Semmes Wienstein monofilament intact to all pedal sites bilateral.   Musculoskeletal: No gross boney pedal deformities bilateral. No pain, crepitus, or limitation noted with foot and ankle range of motion bilateral. Muscular strength 5/5 in all groups tested bilateral.  Gait: Unassisted, Nonantalgic.      Assessment:   Bilateral hallux symptomatic ingrown toenails; onychomycosis     Plan:  -Treatment options discussed including all  alternatives, risks, and complications -Etiology of symptoms were discussed -We discussed partial nail avulsion of both the medial lateral corners of bilateral hallux with chemical matricectomy.  We discussed the procedure as well as postoperative course including alternatives, risks, complications.  She wants to proceed with this at a later date and we will reschedule her at her convenience.  She does not plan on having this done today. -In regards to the nail fungus we discussed options.  After discussion she does proceed with oral therapy.  Prescribed Lamisil however prior to starting this we will check a CBC and LFT.  Trula Slade DPM

## 2018-06-26 ENCOUNTER — Telehealth: Payer: Self-pay | Admitting: *Deleted

## 2018-06-26 MED ORDER — TERBINAFINE HCL 250 MG PO TABS: 250.0000 mg | ORAL_TABLET | Freq: Every day | ORAL | 0 refills | Status: AC

## 2018-06-26 MED FILL — TERBINAFINE HCL 250 MG TAB: 250 | 90 days supply | Qty: 90 | Fill #0

## 2018-06-26 NOTE — Telephone Encounter (Signed)
-----   Message from Trula Slade, DPM sent at 06/24/2018  7:59 AM EST ----- Please let her know that the blood work is normal and she can start Lamisil. Please order 90 days of Lamisil and have her follow up in 4-6 weeks. Thanks.

## 2018-06-26 NOTE — Telephone Encounter (Signed)
That would be fine!  Thanks! 

## 2018-06-26 NOTE — Telephone Encounter (Signed)
I informed pt of Dr. Wagoner's review of results and orders. 

## 2018-07-16 ENCOUNTER — Other Ambulatory Visit: Payer: Self-pay

## 2018-07-16 ENCOUNTER — Ambulatory Visit (INDEPENDENT_AMBULATORY_CARE_PROVIDER_SITE_OTHER): Payer: No Typology Code available for payment source | Admitting: Podiatry

## 2018-07-16 DIAGNOSIS — L600 Ingrowing nail: Secondary | ICD-10-CM

## 2018-07-16 MED ORDER — TRAMADOL HCL 50 MG PO TABS: 50.0000 mg | ORAL_TABLET | Freq: Two times a day (BID) | ORAL | 0 refills | Status: AC | PRN

## 2018-07-16 MED ORDER — CEPHALEXIN 500 MG PO CAPS: 500.0000 mg | ORAL_CAPSULE | Freq: Three times a day (TID) | ORAL | 0 refills | Status: AC

## 2018-07-16 MED FILL — CEPHALEXIN 500 MG CAPSULE: 500 | 6 days supply | Qty: 18 | Fill #0

## 2018-07-16 MED FILL — traMADol HCL 50 MG TABS: 50 | 5 days supply | Qty: 10 | Fill #0

## 2018-07-16 NOTE — Patient Instructions (Signed)

## 2018-07-22 ENCOUNTER — Encounter: Payer: Self-pay | Admitting: Podiatry

## 2018-07-22 NOTE — Progress Notes (Signed)
Subjective: 48 year old female presents the office today for concerns of ongoing ingrown toenails and pain to both of her big toes.  She states that this time she was going proceed with partial nail avulsions given the chronic pain.  She currently denies any drainage or pus come to the area she denies any redness or swelling or any drainage. Denies any systemic complaints such as fevers, chills, nausea, vomiting. No acute changes since last appointment, and no other complaints at this time.   Objective: AAO x3, NAD DP/PT pulses palpable bilaterally, CRT less than 3 seconds There is incurvation present both medial lateral aspects of bilateral hallux toenails with tenderness palpation.  Localized edema there is no significant erythema there is no ascending cellulitis.  There is no drainage of pus. No open lesions or pre-ulcerative lesions.  No pain with calf compression, swelling, warmth, erythema  Assessment: Bilateral chronic hallux ingrown toenails  Plan: -All treatment options discussed with the patient including all alternatives, risks, complications.  -At this time, the patient is requesting partial nail removal with chemical matricectomy to the symptomatic portion of the nail. Risks and complications were discussed with the patient for which they understand and written consent was obtained. Under sterile conditions a total of 3 mL of a mixture of 2% lidocaine plain and 0.5% Marcaine plain was infiltrated in a hallux block fashion. Once anesthetized, the skin was prepped in sterile fashion. A tourniquet was then applied. Next the medial and lateral aspect of hallux nail border was then sharply excised making sure to remove the entire offending nail border. Once the nails were ensured to be removed area was debrided and the underlying skin was intact. There is no purulence identified in the procedure. Next phenol was then applied under standard conditions and copiously irrigated. Silvadene was  applied. A dry sterile dressing was applied. After application of the dressing the tourniquet was removed and there is found to be an immediate capillary refill time to the digit. The patient tolerated the procedure well any complications. Post procedure instructions were discussed the patient for which he verbally understood. Follow-up in one week for nail check or sooner if any problems are to arise. Discussed signs/symptoms of infection and directed to call the office immediately should any occur or go directly to the emergency room. In the meantime, encouraged to call the office with any questions, concerns, changes symptoms. -Patient encouraged to call the office with any questions, concerns, change in symptoms.  -[place] DPM

## 2018-07-23 ENCOUNTER — Ambulatory Visit: Payer: No Typology Code available for payment source | Admitting: Podiatry

## 2018-07-28 ENCOUNTER — Telehealth: Payer: No Typology Code available for payment source | Admitting: Family

## 2018-07-28 DIAGNOSIS — H01001 Unspecified blepharitis right upper eyelid: Secondary | ICD-10-CM | POA: Diagnosis not present

## 2018-07-28 MED ORDER — BACITRACIN-POLYMYXIN B 500-10000 UNIT/GM OP OINT: 1.0000 "application " | TOPICAL_OINTMENT | Freq: Two times a day (BID) | OPHTHALMIC | 0 refills | Status: AC

## 2018-07-28 NOTE — Progress Notes (Signed)
We are sorry that you are not feeling well. Here is how we plan to help!  Based on what you have shared with me it looks like you have a stye.  A stye is an inflammation of the eyelid.  It is often a red, painful lump near the edge of the eyelid that may look like a boil or a pimple.  A stye develops when an infection occurs at the base of an eyelash.   We have made appropriate suggestions for you based upon your presentation: Your symptoms may indicate an infection of the sclera.  The use of anti-inflammatory and antibiotic eye drops for a week will help resolve this condition.  I have sent in bacitracin-polymyxin ophthalmic apply to affected inner lower lid twice a day for 2 weeks. .  If your symptoms do not improve over the next two to three days you should be seen in your doctor's office.  Approximately 5 minutes was spent documenting and reviewing patient's chart.   HOME CARE:   Wash your hands often!  Let the stye open on its own. Don't squeeze or open it.  Don't rub your eyes. This can irritate your eyes and let in bacteria.  If you need to touch your eyes, wash your hands first.  Don't wear eye makeup or contact lenses until the area has healed.  GET HELP RIGHT AWAY IF:   Your symptoms do not improve.  You develop blurred or loss of vision.  Your symptoms worsen (increased discharge, pain or redness).  Thank you for choosing an e-visit.  Your e-visit answers were reviewed by a board certified advanced clinical practitioner to complete your personal care plan.  Depending upon the condition, your plan could have included both over the counter or prescription medications.  Please review your pharmacy choice.  Make sure the pharmacy is open so you can pick up prescription now.  If there is a problem, you may contact your provider through [employer] and have the prescription routed to another pharmacy.    Your safety is important to [place].  If you have drug allergies check  your prescription carefully.  For the next 24 hours you can use MyChart to ask questions about today's visit, request a non-urgent call back, or ask for a work or school excuse.  You will get an email in the next two days asking about your experience.  I hope you that your e-visit has been valuable and will speed your recovery.

## 2018-09-23 MED FILL — VIT D2 1.25 MG (50,000 UNIT: 1.25 MG | 84 days supply | Qty: 12 | Fill #0

## 2018-12-03 MED FILL — SERTRALINE HCL 100 MG TAB: 100 | 90 days supply | Qty: 135 | Fill #0

## 2018-12-03 MED FILL — buPROPion HCL ER (XL) 150 M: 150 | 30 days supply | Qty: 30 | Fill #0

## 2018-12-03 MED FILL — LISINOPRIL-HCTZ 20-25 MG TA: 20-25 | 90 days supply | Qty: 90 | Fill #0

## 2018-12-03 MED FILL — traZODone HCL 50 MG TABS: 50 | 90 days supply | Qty: 90 | Fill #0

## 2018-12-16 MED FILL — PEG-3350 SOLUTION: 420 | 2 days supply | Qty: 4000 | Fill #0

## 2019-01-01 MED FILL — buPROPion HCL ER (XL) 150 M: 150 | 90 days supply | Qty: 90 | Fill #0

## 2019-04-09 MED FILL — buPROPion HCL ER (XL) 150 M: 150 | 90 days supply | Qty: 90 | Fill #0

## 2019-08-26 ENCOUNTER — Other Ambulatory Visit: Payer: Self-pay | Admitting: Family Medicine

## 2019-08-26 DIAGNOSIS — Z1231 Encounter for screening mammogram for malignant neoplasm of breast: Secondary | ICD-10-CM

## 2019-08-27 ENCOUNTER — Ambulatory Visit (INDEPENDENT_AMBULATORY_CARE_PROVIDER_SITE_OTHER): Payer: No Typology Code available for payment source

## 2019-08-27 ENCOUNTER — Other Ambulatory Visit: Payer: Self-pay

## 2019-08-27 DIAGNOSIS — Z1231 Encounter for screening mammogram for malignant neoplasm of breast: Secondary | ICD-10-CM

## 2020-08-12 ENCOUNTER — Other Ambulatory Visit (HOSPITAL_COMMUNITY): Payer: Self-pay

## 2020-08-12 MED ORDER — BUPROPION HCL ER (XL) 150 MG PO TB24
ORAL_TABLET | ORAL | 3 refills | Status: AC
  Filled 2020-08-12: qty 30, 30d supply, fill #0
  Filled 2020-09-02: qty 30, 30d supply, fill #1
  Filled 2020-10-14: qty 30, 30d supply, fill #2
  Filled 2020-11-08: qty 30, 30d supply, fill #3

## 2020-09-02 ENCOUNTER — Other Ambulatory Visit (HOSPITAL_COMMUNITY): Payer: Self-pay

## 2020-09-04 ENCOUNTER — Other Ambulatory Visit (HOSPITAL_COMMUNITY): Payer: Self-pay

## 2020-09-06 ENCOUNTER — Other Ambulatory Visit (HOSPITAL_COMMUNITY): Payer: Self-pay

## 2020-10-14 ENCOUNTER — Other Ambulatory Visit (HOSPITAL_COMMUNITY): Payer: Self-pay

## 2020-11-09 ENCOUNTER — Other Ambulatory Visit (HOSPITAL_COMMUNITY): Payer: Self-pay

## 2021-04-20 ENCOUNTER — Other Ambulatory Visit (HOSPITAL_COMMUNITY): Payer: Self-pay

## 2022-02-14 IMAGING — MG DIGITAL SCREENING BILAT W/ TOMO W/ CAD
8 series · 8 of 24 positions shown · non-contrast
Comparison: Previous exam(s).

CLINICAL DATA: Screening.

EXAM:
DIGITAL SCREENING BILATERAL MAMMOGRAM WITH TOMO AND CAD

[L MLO synth-2D]
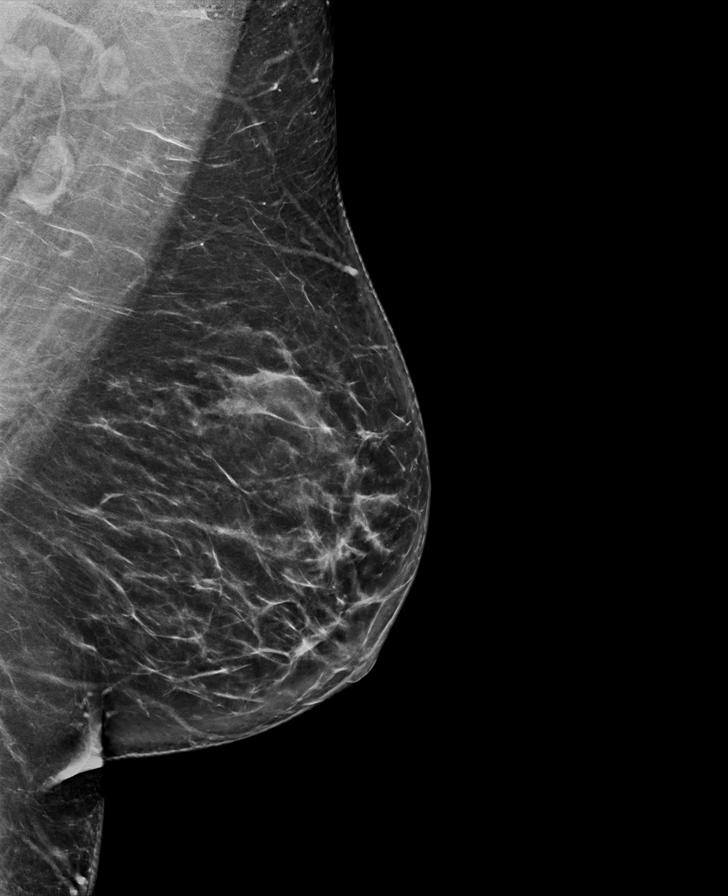

[L CC synth-2D]
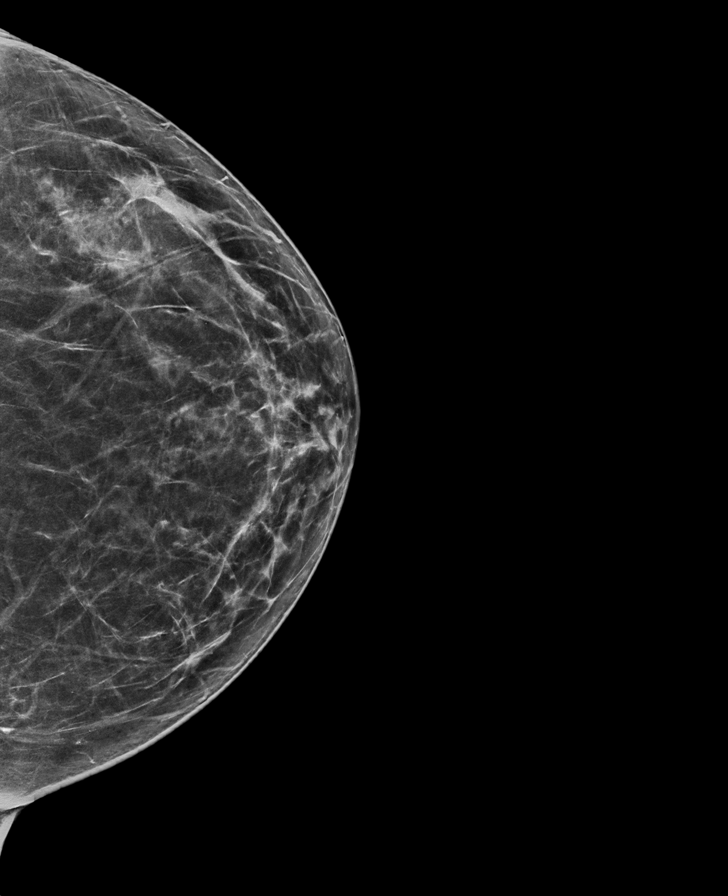

[R MLO synth-2D]
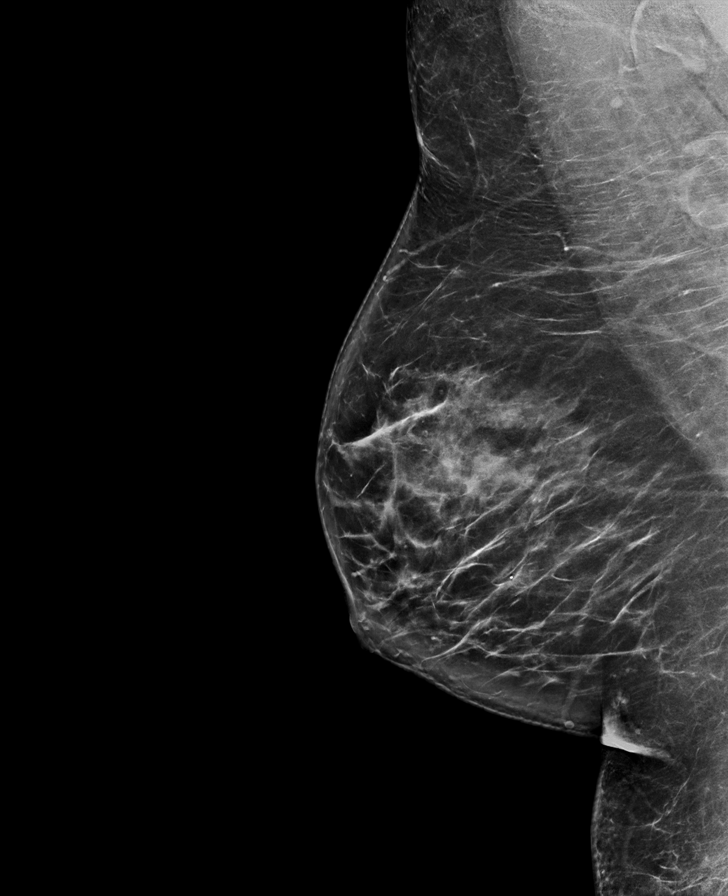

[R CC synth-2D]
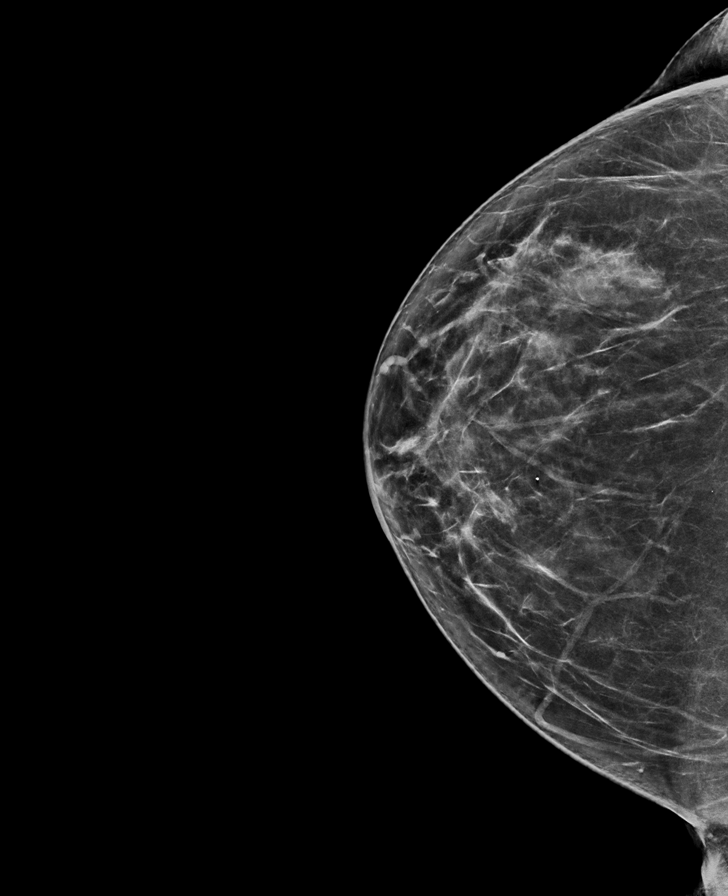

[L MLO tomo · tomo slice 47/92.0]
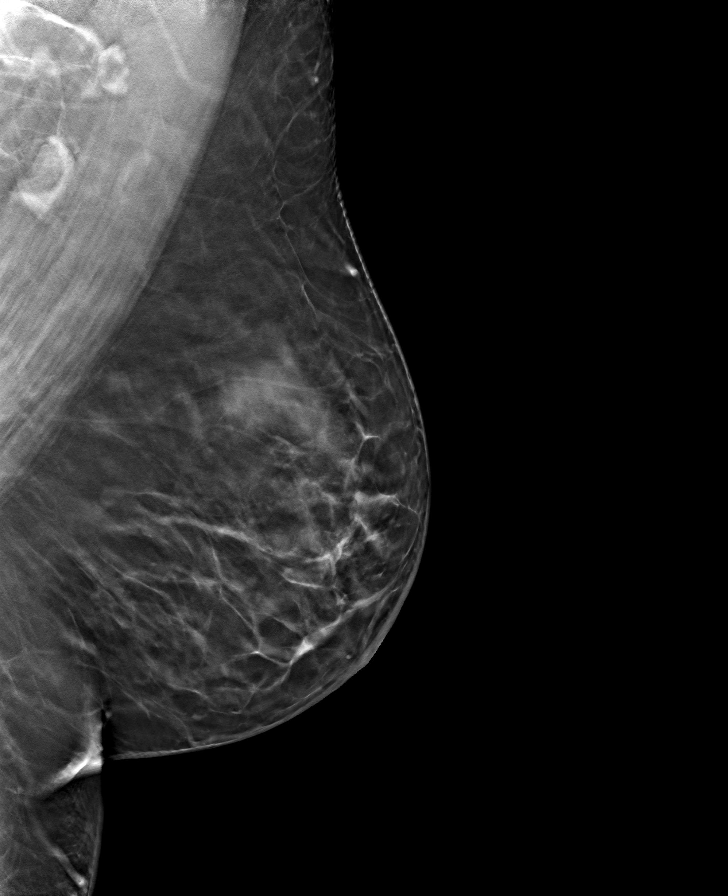

[R MLO tomo · tomo slice 48/95.0]
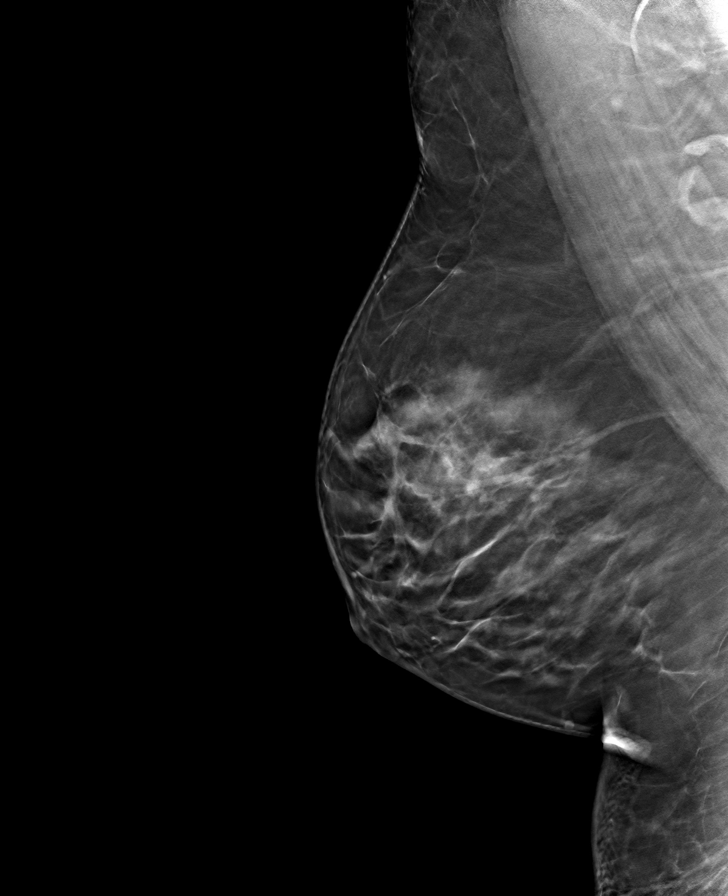

[R CC tomo · tomo slice 41/80.0]
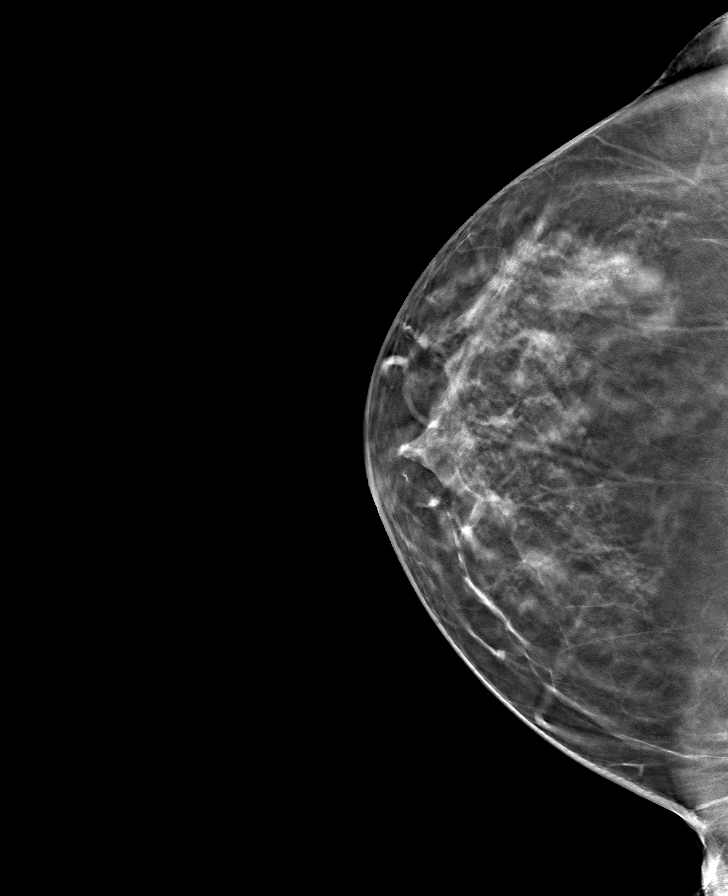

[L CC tomo · tomo slice 39/76.0]
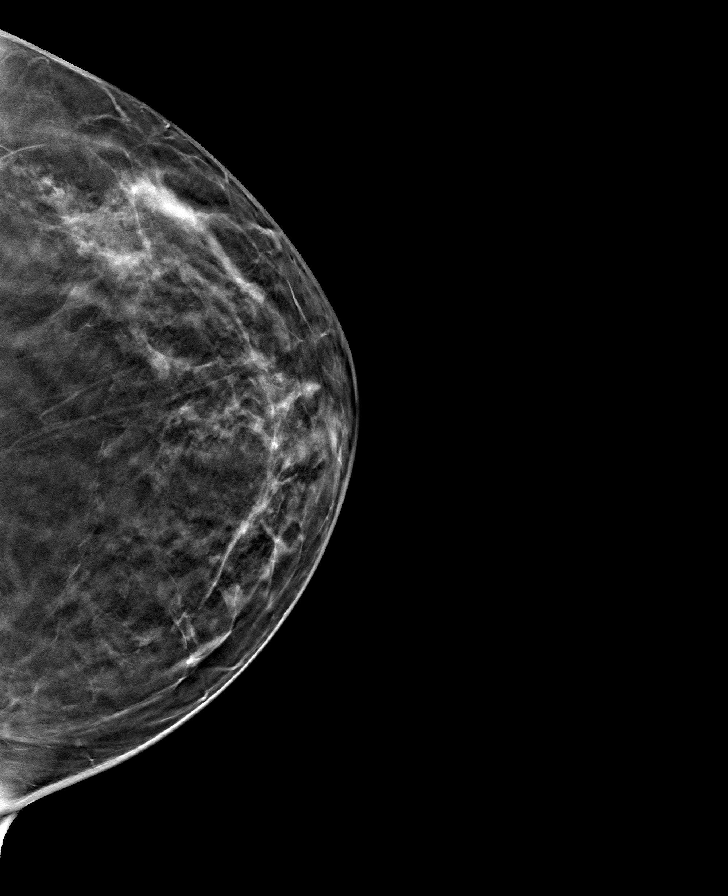

[8 of 24 positions shown; findings below may reference images not displayed]

ACR Breast Density Category b: There are scattered areas of
fibroglandular density.
FINDINGS: There are no findings suspicious for malignancy. Images were
processed with CAD.
IMPRESSION: No mammographic evidence of malignancy. A result letter of this
screening mammogram will be mailed directly to the patient.

RECOMMENDATION:
Screening mammogram in one year. (Code:[id number])

BI-RADS CATEGORY  1: Negative.
